# Patient Record
Sex: Male | Born: 1990 | Race: White | Hispanic: No | Marital: Single | Smoking: Current every day smoker
Health system: Southern US, Community
[De-identification: ages and names within clinical notes are randomized; demographics above are authoritative.]

## PROBLEM LIST (undated history)

## (undated) DIAGNOSIS — F319 Bipolar disorder, unspecified: Secondary | ICD-10-CM

## (undated) DIAGNOSIS — F4310 Post-traumatic stress disorder, unspecified: Secondary | ICD-10-CM

---
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
## 2014-09-18 ENCOUNTER — Encounter (HOSPITAL_COMMUNITY): Payer: Self-pay | Admitting: Emergency Medicine

## 2014-09-18 ENCOUNTER — Emergency Department (HOSPITAL_COMMUNITY)
Admission: EM | Admit: 2014-09-18 | Discharge: 2014-09-19 | Disposition: A | Discharge status: Other Acute Inpatient Hospital | Payer: BLUE CROSS/BLUE SHIELD | Attending: Emergency Medicine | Admitting: Emergency Medicine

## 2014-09-18 DIAGNOSIS — R45851 Suicidal ideations: Secondary | ICD-10-CM | POA: Diagnosis present

## 2014-09-18 DIAGNOSIS — F319 Bipolar disorder, unspecified: Secondary | ICD-10-CM | POA: Diagnosis not present

## 2014-09-18 DIAGNOSIS — F209 Schizophrenia, unspecified: Secondary | ICD-10-CM | POA: Diagnosis not present

## 2014-09-18 DIAGNOSIS — Z79899 Other long term (current) drug therapy: Secondary | ICD-10-CM | POA: Diagnosis not present

## 2014-09-18 DIAGNOSIS — F329 Major depressive disorder, single episode, unspecified: Secondary | ICD-10-CM

## 2014-09-18 DIAGNOSIS — F32A Depression, unspecified: Secondary | ICD-10-CM

## 2014-09-18 HISTORY — DX: Bipolar disorder, unspecified: F31.9

## 2014-09-18 LAB — COMPREHENSIVE METABOLIC PANEL
ALK PHOS: 50 U/L (ref 39–117)
ALT: 36 U/L (ref 0–53)
AST: 31 U/L (ref 0–37)
Albumin: 4.4 g/dL (ref 3.5–5.2)
Anion gap: 12 (ref 5–15)
BUN: 9 mg/dL (ref 6–23)
CO2: 22 mmol/L (ref 19–32)
Calcium: 10 mg/dL (ref 8.4–10.5)
Chloride: 103 mmol/L (ref 96–112)
Creatinine, Ser: 0.86 mg/dL (ref 0.50–1.35)
GFR calc non Af Amer: >90 mL/min (ref >90)
GLUCOSE: 100 mg/dL — AB (ref 70–99)
POTASSIUM: 3.6 mmol/L (ref 3.5–5.1)
SODIUM: 137 mmol/L (ref 135–145)
TOTAL PROTEIN: 7.9 g/dL (ref 6.0–8.3)
Total Bilirubin: 0.5 mg/dL (ref 0.3–1.2)

## 2014-09-18 LAB — RAPID URINE DRUG SCREEN, HOSP PERFORMED
AMPHETAMINES: NOT DETECTED
Barbiturates: NOT DETECTED
Benzodiazepines: NOT DETECTED
Cocaine: NOT DETECTED
OPIATES: NOT DETECTED
Tetrahydrocannabinol: NOT DETECTED

## 2014-09-18 LAB — CBC
HEMATOCRIT: 42.4 % (ref 39.0–52.0)
HEMOGLOBIN: 15.6 g/dL (ref 13.0–17.0)
MCH: 32.2 pg (ref 26.0–34.0)
MCHC: 36.8 g/dL — AB (ref 30.0–36.0)
MCV: 87.4 fL (ref 78.0–100.0)
Platelets: 218 10*3/uL (ref 150–400)
RBC: 4.85 MIL/uL (ref 4.22–5.81)
RDW: 12.5 % (ref 11.5–15.5)
WBC: 6.2 10*3/uL (ref 4.0–10.5)

## 2014-09-18 LAB — ACETAMINOPHEN LEVEL: Acetaminophen (Tylenol), Serum: <10 ug/mL — ABNORMAL LOW (ref 10–30)

## 2014-09-18 LAB — SALICYLATE LEVEL: Salicylate Lvl: <4 mg/dL (ref 2.8–20.0)

## 2014-09-18 LAB — ETHANOL: Alcohol, Ethyl (B): <5 mg/dL (ref 0–9)

## 2014-09-18 MED ORDER — NICOTINE 21 MG/24HR TD PT24: 21.0000 mg | MEDICATED_PATCH | Freq: Every day | TRANSDERMAL | Status: DC

## 2014-09-18 MED ORDER — IBUPROFEN 400 MG PO TABS: 600.0000 mg | ORAL_TABLET | Freq: Three times a day (TID) | ORAL | Status: DC | PRN

## 2014-09-18 MED ORDER — LORAZEPAM 1 MG PO TABS: 1.0000 mg | ORAL_TABLET | Freq: Three times a day (TID) | ORAL | Status: DC | PRN

## 2014-09-18 MED ORDER — ACETAMINOPHEN 325 MG PO TABS: 650.0000 mg | ORAL_TABLET | ORAL | Status: DC | PRN

## 2014-09-18 MED ORDER — ONDANSETRON HCL 4 MG PO TABS: 4.0000 mg | ORAL_TABLET | Freq: Three times a day (TID) | ORAL | Status: DC | PRN

## 2014-09-18 MED ORDER — ZOLPIDEM TARTRATE 5 MG PO TABS
5.0000 mg | ORAL_TABLET | Freq: Every evening | ORAL | Status: DC | PRN
  Administered 2014-09-18: 5 mg via ORAL
  Filled 2014-09-18: qty 1

## 2014-09-18 NOTE — ED Notes (Signed)
Pt given a Malaw[place]urkey sandwich since dinner will not be served until later. Pt cooperative/appreciative at this time.

## 2014-09-18 NOTE — ED Notes (Signed)
Pt will not look or answer RN; pt clinching fists and appears very agitated; security called to stand by

## 2014-09-18 NOTE — ED Notes (Signed)
Pt here from home with c/o si, no hi . Pt somewhat agitated but cooperative

## 2014-09-18 NOTE — ED Notes (Signed)
Pts mother called and is requesting patient to be transferred to [facility] because his psychiatrist is Dr. Cherylann RatelBrian Farrah and he is the Chief of Psychiatry at [facility].

## 2014-09-18 NOTE — Progress Notes (Addendum)
Patient has been referred for IP treatment to the following facilities: [facility], [facility], [facility], [facility], [facility], and [facility].  [facility] - [facility], fax referral. Per Wynona Caneshristine, will call writer back. [facility] - per Morrie SheldonAshley, at capacity now, fax referral. Per Ashley,referral faxed to the billing office, follow up in am. [facility], referral not found. Referral faxed again at 11pm. [facility] - per Jonny RuizJohn, fax referral and I will take a look at it and call you back. Per Jonny RuizJohn, referral not found. Referral faxed at 11pm [facility] - per Victorino DikeJennifer, fax referral, low acuity beds right now. Per Kriste BasqueBecky, call Pat in am to follow up on referral. [facility] - per Molly Maduroobert, fax referral, couple of adult beds. Per Jasmine DecemberSharon, at this time we haven't reviewed any referrals. [facility] - per [facility], fax referral. When followed up - voicemail. [facility] - per Thayer Ohmhris, fax referral. When writer followed up - sent to voicemail.  [facility] [address] [facility] - voicemail  [facility]   CSW will continue to seek placement.  Melbourne Abtsatia Omarion Minnehan, LCSWA Disposition staff 09/18/2014 4:40 PM
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
## 2014-09-18 NOTE — ED Notes (Signed)
[facility] and she states the patient meets inpatient criteria and placement will be sought but not until this evening around 7pm.
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
## 2014-09-18 NOTE — ED Notes (Signed)
Meal tray was ordered for dinner.

## 2014-09-18 NOTE — ED Notes (Signed)
Pt requesting medication to help him sleep.

## 2014-09-18 NOTE — ED Notes (Addendum)
Patients mother has called three times so far wanting updates on the patient with numerous questioning regarding patient plan, well-being of her son and wanting to know if the patient has been transferred to another facility yet.

## 2014-09-18 NOTE — ED Notes (Signed)
Myself and Shanda BumpsJessica, RN went into room to talk to pt; Pt not answering any questions; keeps breathing heavy, clutching his fists and rubbing his fists against his legs with intent

## 2014-09-19 ENCOUNTER — Inpatient Hospital Stay (HOSPITAL_COMMUNITY)
Admission: AD | Admit: 2014-09-19 | Discharge: 2014-09-24 | DRG: 885 | Disposition: A | Discharge status: Home / Self Care | Payer: BLUE CROSS/BLUE SHIELD | Source: Intra-hospital | Attending: Psychiatry | Admitting: Psychiatry

## 2014-09-19 ENCOUNTER — Encounter (HOSPITAL_COMMUNITY): Payer: Self-pay | Admitting: *Deleted

## 2014-09-19 DIAGNOSIS — F3130 Bipolar disorder, current episode depressed, mild or moderate severity, unspecified: Secondary | ICD-10-CM

## 2014-09-19 DIAGNOSIS — F3131 Bipolar disorder, current episode depressed, mild: Secondary | ICD-10-CM | POA: Diagnosis present

## 2014-09-19 DIAGNOSIS — F319 Bipolar disorder, unspecified: Secondary | ICD-10-CM | POA: Diagnosis present

## 2014-09-19 DIAGNOSIS — F329 Major depressive disorder, single episode, unspecified: Secondary | ICD-10-CM

## 2014-09-19 DIAGNOSIS — G4700 Insomnia, unspecified: Secondary | ICD-10-CM | POA: Diagnosis present

## 2014-09-19 DIAGNOSIS — R440 Auditory hallucinations: Secondary | ICD-10-CM | POA: Diagnosis not present

## 2014-09-19 DIAGNOSIS — R45851 Suicidal ideations: Secondary | ICD-10-CM | POA: Diagnosis present

## 2014-09-19 DIAGNOSIS — R45850 Homicidal ideations: Secondary | ICD-10-CM | POA: Diagnosis present

## 2014-09-19 DIAGNOSIS — F419 Anxiety disorder, unspecified: Secondary | ICD-10-CM | POA: Diagnosis present

## 2014-09-19 DIAGNOSIS — Z630 Problems in relationship with spouse or partner: Secondary | ICD-10-CM | POA: Diagnosis not present

## 2014-09-19 DIAGNOSIS — F4312 Post-traumatic stress disorder, chronic: Secondary | ICD-10-CM

## 2014-09-19 MED ORDER — CITALOPRAM HYDROBROMIDE 10 MG PO TABS
10.0000 mg | ORAL_TABLET | Freq: Every day | ORAL | Status: DC
  Administered 2014-09-19: 10 mg via ORAL
  Filled 2014-09-19: qty 1

## 2014-09-19 MED ORDER — ALUM & MAG HYDROXIDE-SIMETH 200-200-20 MG/5ML PO SUSP: 30.0000 mL | ORAL | Status: DC | PRN

## 2014-09-19 MED ORDER — CLONAZEPAM 0.5 MG PO TABS: 1.0000 mg | ORAL_TABLET | Freq: Every day | ORAL | Status: DC

## 2014-09-19 MED ORDER — MAGNESIUM HYDROXIDE 400 MG/5ML PO SUSP: 30.0000 mL | Freq: Every day | ORAL | Status: DC | PRN

## 2014-09-19 MED ORDER — ACETAMINOPHEN 325 MG PO TABS
650.0000 mg | ORAL_TABLET | Freq: Four times a day (QID) | ORAL | Status: DC | PRN
  Administered 2014-09-19: 650 mg via ORAL
  Filled 2014-09-19: qty 2

## 2014-09-19 MED ORDER — TRAZODONE HCL 50 MG PO TABS
50.0000 mg | ORAL_TABLET | Freq: Every evening | ORAL | Status: DC | PRN
  Administered 2014-09-19: 50 mg via ORAL
  Filled 2014-09-19 (×2): qty 1

## 2014-09-19 MED ORDER — VALACYCLOVIR HCL 500 MG PO TABS: 500.0000 mg | ORAL_TABLET | Freq: Every day | ORAL | Status: DC

## 2014-09-19 MED ORDER — RISPERIDONE 0.5 MG PO TABS: 3.0000 mg | ORAL_TABLET | Freq: Every day | ORAL | Status: DC

## 2014-09-19 NOTE — ED Notes (Signed)
CALLED [facility] TO TRANSPORT. THEY ARE UNABLE TO TRANSPORT UNTIL AROUND 415 TODAY

## 2014-09-19 NOTE — ED Notes (Signed)
PT HUSBAND HAS VISITED. NO CONFLICT NOTED. PT PARENTS ALSO HERE BUT LEFT AS SOON AS PT HUSBAND ARRIVED
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
## 2014-09-19 NOTE — ED Notes (Signed)
[facility]

## 2014-09-19 NOTE — Tx Team (Signed)
Initial Interdisciplinary Treatment Plan   PATIENT STRESSORS: Marital or family conflict   PATIENT STRENGTHS: Average or above average intelligence Communication skills General fund of knowledge Motivation for treatment/growth Physical Health Supportive family/friends   PROBLEM LIST: Problem List/Patient Goals Date to be addressed Date deferred Reason deferred Estimated date of resolution  "I'd like to get better." 09/19/14           "I want to prevent breaking down again." 09/19/14           Command AH to harm self 09/19/14                              DISCHARGE CRITERIA:  Improved stabilization in mood, thinking, and/or behavior Motivation to continue treatment in a less acute level of care Reduction of life-threatening or endangering symptoms to within safe limits Verbal commitment to aftercare and medication compliance  PRELIMINARY DISCHARGE PLAN: Attend aftercare/continuing care group Outpatient therapy Return to previous living arrangement  PATIENT/FAMIILY INVOLVEMENT: This treatment plan has been presented to and reviewed with the patient, Merton BorderRobert Ma, and/or family member.  The patient and family have been given the opportunity to ask questions and make suggestions.  Lawrence MarseillesFriedman, Harlon Kutner Eakes 09/19/2014, 6:36 PM

## 2014-09-19 NOTE — ED Provider Notes (Signed)
[facility], Dr. Elna BreslowEappen accepting.  Pricilla LovelessScott Brittani Purdum, MD 09/19/14 [phone]

## 2014-09-19 NOTE — ED Notes (Signed)
Pt's mother called to request placement at [facility]. Also, pt's mother was advised of visitation policy.

## 2014-09-19 NOTE — Progress Notes (Signed)
Did not attend group 

## 2014-09-19 NOTE — Progress Notes (Signed)
[facility]. Rosey BathKelly Railynn Ballo, RN

## 2014-09-19 NOTE — Progress Notes (Addendum)
CSW followed up on referrals for inpatient tx. ([place] [facility])  Referral faxed to and/or under review: [facility]- per Synetta FailAnita, fax [facility] per [facility], fax referral [facility]- per Angelica, fax referral [facility]- per Christiane HaJonathan, not yet reviewed but as pt has no insurance would be for [employer] waitlist  [facility]- per Thayer Ohmhris, not yet reviewed  Voicemail: [facility]- left 2 voicemails as pt's mother is requesting this placement  [facility]- per Rogers Blockerean - [facility]- per [facility]- per Agustin Creearlene  Decl[facility] at: [facility]- per Wylene MenLacey d/t financial reasons- no insurance coverage would require $4000 deposit  Ilean SkillMeghan Kwabena Strutz, MSW, [facility] Clinical Social Work, Disposition  09/19/2014 [phone]

## 2014-09-19 NOTE — Progress Notes (Signed)
Pts wife (legal spouse) emergency contact info Thayer OhmChris [phone]

## 2014-09-19 NOTE — Progress Notes (Signed)
Patient in bed resting at the beginning of this shift. His husband visited and patient requested that the sealed envelope in his locker be remove and given to his husband.  Writer notified the security and brought the sealed envelope to patient. He asked his husband to take the envelope open it and make sure everything was intact  inside the enveloped. Husband opended the envelope and told patient the  items that were in the envelope. Writer asked patient if they were accurate, patient said yes. Writer asked patient to sign in off on the sealed envelope that he received the envelope. Patient seemed uninterested in other assessment. Q 15 minute check continues as ordered to maintain safety.

## 2014-09-19 NOTE — Progress Notes (Signed)
Per [name] [name], [name] [name], [facility]2 by Dr. Elna [name].   Spoke with [facility] RN regarding pt's disposition.  [name] [name], MSW, LCSWA Clinical Social Work, Disposition  09/19/2014 [phone]

## 2014-09-20 ENCOUNTER — Encounter (HOSPITAL_COMMUNITY): Payer: Self-pay | Admitting: Registered Nurse

## 2014-09-20 DIAGNOSIS — Z630 Problems in relationship with spouse or partner: Secondary | ICD-10-CM

## 2014-09-20 DIAGNOSIS — R440 Auditory hallucinations: Secondary | ICD-10-CM

## 2014-09-20 DIAGNOSIS — R45851 Suicidal ideations: Secondary | ICD-10-CM

## 2014-09-20 DIAGNOSIS — R45850 Homicidal ideations: Secondary | ICD-10-CM

## 2014-09-20 MED ORDER — CLONAZEPAM 1 MG PO TABS
1.0000 mg | ORAL_TABLET | Freq: Every day | ORAL | Status: DC
  Administered 2014-09-20 – 2014-09-23 (×3): 1 mg via ORAL
  Filled 2014-09-20 (×4): qty 1

## 2014-09-20 MED ORDER — RISPERIDONE 2 MG PO TABS
2.0000 mg | ORAL_TABLET | Freq: Every day | ORAL | Status: DC
Start: 2014-09-20 — End: 2014-09-24
  Administered 2014-09-20 – 2014-09-24 (×5): 2 mg via ORAL
  Filled 2014-09-20 (×2): qty 1
  Filled 2014-09-20: qty 4
  Filled 2014-09-20 (×6): qty 1

## 2014-09-20 MED ORDER — CITALOPRAM HYDROBROMIDE 20 MG PO TABS
20.0000 mg | ORAL_TABLET | Freq: Every day | ORAL | Status: DC
  Administered 2014-09-20 – 2014-09-24 (×5): 20 mg via ORAL
  Filled 2014-09-20 (×2): qty 1
  Filled 2014-09-20: qty 4
  Filled 2014-09-20 (×6): qty 1
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
## 2014-09-20 NOTE — Progress Notes (Signed)
Pt did not attend wrap up group this evening.  

## 2014-09-20 NOTE — [facility] Group Notes (Signed)
[facility] Group Notes:  (Clinical Social Work)  09/20/2014  11:15-12:00PM  Summary of Progress/Problems:   The main focus of today's process group was to discuss patients' feelings related to being hospitalized, as well as the difference between "being" and "having" a mental health diagnosis.  It was agreed in general by the group that it would be preferable to avoid future hospitalizations, and we discussed means of doing that.  As a follow-up, problems with adhering to medication recommendations were discussed.  The patient expressed their primary feeling about being hospitalized is "comfortable, [facility] where I stayed to age 24yo."  He stated he knows he needs help, "I'm mentally ill."  He wishes his comic book had not been taken away, but understands this was done because it was so heavy.  He later stated, "I know I'm crazy" and the group disagreed with this, stated that there is no such thing as "normal."  He also talked about knowing he needs to be in the hospital to get better, because he had gotten mad at his wife, felt like hurting her and himself, and it was much better for him to be hospitalized.  He wants to take his medications at discharge to avoid coming back to the hospital.  Type of Therapy:  Group Therapy - Process  Participation Level:  Active  Participation Quality:  Attentive, Sharing and Supportive  Affect:  Not Congruent  Cognitive:  Disorganized  Insight:  Improving  Engagement in Therapy:  Engaged  Modes of Intervention:  Exploration, Discussion  Ambrose MantleMareida Grossman-Orr, LCSW 09/20/2014, 12:16 PM

## 2014-09-20 NOTE — [facility] Group Notes (Signed)
[facility] Group Notes:  (Nursing/MHT/Case Management/Adjunct)  Date:  09/20/2014  Time:  11:42 AM  Type of Therapy:  Psychoeducational Skills  Participation Level:  Active  Participation Quality:  Appropriate  Affect:  Appropriate  Cognitive:  Appropriate  Insight:  Appropriate  Engagement in Group:  Engaged  Modes of Intervention:  Discussion  Summary of Progress/Problems: Pt did attend self inventory group.  Jacquelyne BalintForrest, Munirah Doerner Shanta 09/20/2014, 11:42 AM
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
## 2014-09-20 NOTE — Progress Notes (Signed)
Patient ID: [name] BorderRobert Wortmann, male   DOB: 07/14/1990, 24 y.o.   MRN: [id number]   D: Pt has been very flat and depressed today on the unit. Pt reported that he was tired and that he just wanted to rest. Pt remained in the bed most of the day, he did get up to take medication at lunch. Pt reported that his depression was a 6, his hopelessness was a 6, and his anxiety was a 2. Pt reported that his goal for today was to stop listening to the voices . Pt reported being negative HI, no AH/VH noted. Pt did report being positive for SI, and was able to contract for safety. A: 15 min checks continued for patient safety. R: Pt safety maintained.

## 2014-09-21 ENCOUNTER — Encounter (HOSPITAL_COMMUNITY): Payer: Self-pay | Admitting: Registered Nurse

## 2014-09-21 DIAGNOSIS — F3130 Bipolar disorder, current episode depressed, mild or moderate severity, unspecified: Secondary | ICD-10-CM

## 2014-09-21 DIAGNOSIS — F3131 Bipolar disorder, current episode depressed, mild: Secondary | ICD-10-CM | POA: Diagnosis present

## 2014-09-21 NOTE — Progress Notes (Signed)
D: Pt slept thru most of the shift. However, when asked about his day pt stated, "I haven't heard voices since 1500. I haven't heard them since I took my risperdal and citalopram. Pt voiced no questions or concerns at this time.  A:  Support and encouragement was offered. 15 min checks continued for safety.  R: Pt remains safe.

## 2014-09-21 NOTE — Progress Notes (Signed)
[facility] Group Notes:  (Nursing/MHT/Case Management/Adjunct)  Date:  09/21/2014  Time:  8:56 PM  Type of Therapy:  Psychoeducational Skills  Participation Level:  Active  Participation Quality:  Appropriate  Affect:  Appropriate  Cognitive:  Appropriate  Insight:  Appropriate  Engagement in Group:  Engaged  Modes of Intervention:  Education  Summary of Progress/Problems: The patient expressed that he had a pretty good day since he was no longer hearing voices. In addition, he stated that he was reading a lot today. As a theme for the day, his coping skill is to read, watch t.v., and lift weights.   Hazle CocaGOODMAN, Hadlea Furuya S 09/21/2014, 8:56 PM

## 2014-09-21 NOTE — Progress Notes (Signed)
[facility]  09/21/2014 2:40 PM [name] BorderRobert Brittian  MRN:  [id number]    Subjective:  Patient states that he is feeling much better.  "I feel good; I'm sleeping good; I'm eating well.  Yesterday I was a little sleepy when my mom and dad came; but I feel good now."  Patient states that he constantly has thoughts of how to kill himself.  "I constantly have thoughts of how to kill my self; it never goes away.  I don't want to die; I just have the thoughts.  I only had them once today when I was in the shower. Other than that I'm pretty happy.  I been going to group and contributing to group.". He is able to contract for safety while in hospital.   Objective:  Patient is tolerating his medications without adverse reactions and he is participating in group sessions. Patient was seen looking out his window talking when asked what he was doing patient stated "I was just looking at the squirrel and asking him what he is doing with the leaf."      Principal Problem: Bipolar I disorder, most recent episode depressed Diagnosis:   Patient Active Problem List   Diagnosis Date Noted  . Bipolar I disorder, most recent episode depressed [F31.30] 09/21/2014  . Depression [F32.9] 09/19/2014   Total Time spent with patient: 20 minutes   Past Medical History:  Past Medical History  Diagnosis Date  . Bipolar 1 disorder    History reviewed. No pertinent past surgical history. Family History: History reviewed. No pertinent family history. Social History:  History  Alcohol Use  . Yes     History  Drug Use No    History   Social History  . Marital Status: Single    Spouse Name: N/A  . Number of Children: N/A  . Years of Education: N/A   Social History Main Topics  . Smoking status: Never Smoker   . Smokeless tobacco: Not on file  . Alcohol Use: Yes  . Drug Use: No  . Sexual Activity: Not on file   Other Topics Concern  . None   Social History Narrative   Additional History:     Sleep: Good  Appetite:  Good   Assessment:   Musculoskeletal: Strength & Muscle Tone: within normal limits Gait & Station: normal Patient leans: N/A   Psychiatric Specialty Exam: Physical Exam  Constitutional: He is oriented to person, place, and time.  Neck: Normal range of motion.  Respiratory: Effort normal.  Musculoskeletal: Normal range of motion.  Neurological: He is alert and oriented to person, place, and time.  Skin: Skin is warm and dry.  Psychiatric: His mood appears anxious. He is actively hallucinating. He exhibits a depressed mood.    Review of Systems  Psychiatric/Behavioral: Positive for depression and hallucinations. The patient is nervous/anxious.        Agitation an aggressive behavior  All other systems reviewed and are negative.    Blood pressure 112/63, pulse 83, temperature 97.6 F (36.4 C), temperature source Oral, resp. rate 18, height 5\' 11"  (1.803 m), weight 87.091 kg (192 lb).Body mass index is 26.79 kg/(m^2).  General Appearance: Casual  Eye Contact::  Good  Speech:  Clear and Coherent and Normal Rate  Volume:  Normal  Mood:  Anxious, Depressed and Irritable  Affect:  Non-Congruent and Depressed  Thought Process:  Circumstantial and Linear  Orientation:  Full (Time, Place, and Person)  Thought Content:  Hallucinations: Auditory, Paranoid Ideation  and Rumination  Denies hallucinations at this time  Suicidal Thoughts:  Yes.  without intent/plan , contract for safety  Homicidal Thoughts:  No Denies homicidal ideation at this time  Memory:  Recent;   Fair Remote;   Fair  Judgement:  Impaired  Insight:  Fair  Psychomotor Activity:  Restlessness  Concentration:  Fair  Recall:  Good  Fund of Knowledge:Good  Language: Good  Akathisia:  No  Handed:  Right  AIMS (if indicated):     Assets:  Communication Skills Desire for Improvement Housing Social Support  ADL's:  Intact  Cognition: WNL  Sleep:  Number of Hours: 6.75     Current  Medications: Current Facility-Administered Medications  Medication Dose Route Frequency Provider Last Rate Last Dose  . acetaminophen (TYLENOL) tablet 650 mg  650 mg Oral Q6H PRN Adonis Brook, NP   650 mg at 09/19/14 1846  . alum & mag hydroxide-simeth (MAALOX/MYLANTA) 200-200-20 MG/5ML suspension 30 mL  30 mL Oral Q4H PRN Adonis Brook, NP      . citalopram (CELEXA) tablet 20 mg  20 mg Oral Daily Leata Mouse, MD   20 mg at 09/21/14 0751  . clonazePAM (KLONOPIN) tablet 1 mg  1 mg Oral QHS Leata Mouse, MD   1 mg at 09/20/14 2233  . magnesium hydroxide (MILK OF MAGNESIA) suspension 30 mL  30 mL Oral Daily PRN Adonis Brook, NP      . risperiDONE (RISPERDAL) tablet 2 mg  2 mg Oral Daily Leata Mouse, MD   2 mg at 09/21/14 0751  . traZODone (DESYREL) tablet 50 mg  50 mg Oral QHS PRN Adonis Brook, NP   50 mg at 09/19/14 2231    Lab Results: No results found for this or any previous visit (from the past 48 hour(s)).  Physical Findings: AIMS: Facial and Oral Movements Muscles of Facial Expression: None, normal Lips and Perioral Area: None, normal Jaw: None, normal Tongue: None, normal,Extremity Movements Upper (arms, wrists, hands, fingers): None, normal Lower (legs, knees, ankles, toes): None, normal, Trunk Movements Neck, shoulders, hips: None, normal, Overall Severity Severity of abnormal movements (highest score from questions above): None, normal Incapacitation due to abnormal movements: None, normal Patient's awareness of abnormal movements (rate only patient's report): No Awareness, Dental Status Current problems with teeth and/or dentures?: No Does patient usually wear dentures?: No  CIWA:    COWS:     Treatment Plan Summary: Daily contact with patient to assess and evaluate symptoms and progress in treatment and Medication management  1.  2. Admitted for crisis management and stabilization 3. Medication management to reduce current  symptoms to bale line and improve the patient's overall level of  functioning:  Risperidone for psychosis, Celexa for depression and  Klonopin for anxiety  4. Treat health problems as indicated 5. Develop treatment plan to decrease risk of relapse upon discharge and the need for  readmission. 6. Psycho-social education regarding relapse prevention and self care. 7. Health care follow up as needed for medical problems     Medical Decision Making:  Review of Psycho-Social Stressors (1), Review or order clinical lab tests (1), Review of Last Therapy Session (1), Independent Review of image, tracing or specimen (2) and Review of Medication Regimen & Side Effects (2)   Rankin, Shuvon, FNP-BC 09/21/2014, 2:40 PM  Reviewed the information documented and agree with the treatment plan.  Annalissa Murphey,JANARDHAHA R. 09/22/2014 8:19 AM

## 2014-09-21 NOTE — [facility] Group Notes (Signed)
[facility] Group Notes:  (Nursing/MHT/Case Management/Adjunct)  Date:  09/21/2014  Time:  3:01 PM  Type of Therapy:  Psychoeducational Skills  Participation Level:  Active  Participation Quality:  Appropriate  Affect:  Appropriate  Cognitive:  Appropriate  Insight:  Appropriate  Engagement in Group:  Engaged  Modes of Intervention:  Discussion  Summary of Progress/Problems: Pt did attend self inventory group.   Jacquelyne BalintForrest, Lashunda Greis Shanta 09/21/2014, 3:01 PM

## 2014-09-21 NOTE — Progress Notes (Addendum)
Patient ID: [name] BorderRobert [name], male   DOB: 26-Apr-1991, 24 y.o.   MRN: [id number]   D: Pt has been very flat and depressed on the unit today. Pt reported that he did feel better since being back on his medication. Pt did report that he was actively suicidal, and had a plan to hurt self. This Clinical research associatewriter processed with patient, patient reported that he would contract for safety. Pt reported that he was positive for voices, but they were getting better. Pt reported that his goal for today was to not thinking about hurting self. Pt reported being negative HI, no VH noted. A: 15 min checks continued for patient safety. R: Pt safety maintained.

## 2014-09-21 NOTE — [facility] Group Notes (Signed)
[facility] Group Notes: (Clinical Social Work)   09/21/2014      Type of Therapy:  Group Therapy   Participation Level:  Did Not Attend despite MHT prompting   Ambrose MantleMareida Grossman-Orr, LCSW 09/21/2014, 1:04 PM

## 2014-09-22 NOTE — [facility] Group Notes (Signed)
[facility] LCSW Group Therapy  09/22/2014 1:15 pm  Type of Therapy: Process Group Therapy  Participation Level:  Active  Participation Quality:  Appropriate  Affect:  Flat  Cognitive:  Oriented  Insight:  Improving  Engagement in Group:  Limited  Engagement in Therapy:  Limited  Modes of Intervention:  Activity, Clarification, Education, Problem-solving and Support  Summary of Progress/Problems: Today's group addressed the issue of overcoming obstacles.  Patients were asked to identify their biggest obstacle post d/c that stands in the way of their on-going success, and then problem solve as to how to manage this.  Talked about his on-going depression he has been dealing with since age 24.  Was willing/able to problem solve re: what has been helpful in past, and determined that he does best when spending ample time outside and staying occupied.  Has not worked since Dec., which he states is a large contributor to his depression.  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  Ida Rogueorth, Beyza Bellino B 09/22/2014   2:36 PM

## 2014-09-22 NOTE — Progress Notes (Signed)
   D: Pt informed the writer that he's scheduled for discharge tomorrow.  Initially pt stated he "always" hears voices. However, pt stated earlier that he doesn't hear the voices once he takes his meds. Pt clarified and informed the writer that he likes to take his meds in the morning which helps alleviate the voices. Pt voiced no questions or concerns.  A:  Support and encouragement was offered. 15 min checks continued for safety.  R: Pt remains safe.

## 2014-09-22 NOTE — [facility] Group Notes (Signed)
[name]   09/22/2014 8:42 AM  Participation Quality:  Engaged  Mood/Affect:  Flat  Depression Rating:  4  Anxiety Rating:  3  Thoughts of Suicide:  No Will you contract for safety?   NA  Current AVH:  No  Plan for Discharge/Comments:  Pt states he was hearing voices and seeing things prior to admission.  Lives at home with wife and sees outpt therapist and psychiatrist.  Insists he was compliant with medication.  States he needs medication adjustment and the routine of our milieu.  "I've been eating well and sleeping well here.  I'm doing a lot better."  Transportation Means: family  Supports: family  Kiribatiorth, [name] [name]
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
## 2014-09-22 NOTE — Progress Notes (Signed)
The focus of this group is to help patients review their daily goal of treatment and discuss progress on daily workbooks.  Pt did not attend the evening group session. 

## 2014-09-22 NOTE — Progress Notes (Signed)
Patient in bed at the beginning of the shift. His mother sat with him visiting. He endorsed a great day and reported that he is feeling better. Patient denied SI/HI and denied Hallucinations. Writer encouraged and supported patient. Q 15 minute check continues as ordered to maintain safety.

## 2014-09-22 NOTE — [facility] Suicide Risk Assessment (Signed)
[facility] INPATIENT:  Family/Significant Other Suicide Prevention Education  Suicide Prevention Education:  Education Completed;  Thayer OhmChris, [place], [id number] [phone] has been identified by the patient as the family member/significant other with whom the patient will be residing, and identified as the person(s) who will aid the patient in the event of a mental health crisis (suicidal ideations/suicide attempt).  With written consent from the patient, the family member/significant other has been provided the following suicide prevention education, prior to the and/or following the discharge of the patient.  The suicide prevention education provided includes the following:  Suicide risk factors  Suicide prevention and interventions  National Suicide Hotline telephone number  [facility] assessment telephone number  [facility] Emergency Assistance 911  [facility] and/or Residential Mobile Crisis Unit telephone number  Request made of family/significant other to:  Remove weapons (e.g., guns, rifles, knives), all items previously/currently identified as safety concern.    Remove drugs/medications (over-the-counter, prescriptions, illicit drugs), all items previously/currently identified as a safety concern.  The family member/significant other verbalizes understanding of the suicide prevention education information provided.  The family member/significant other agrees to remove the items of safety concern listed above.  [name] [name], [name] [name] 09/22/2014, 4:39 PM

## 2014-09-22 NOTE — Progress Notes (Signed)
Patient ID: [name] [name], male   DOB: Jan 16, 1991, 24 y.o.   MRN: [id number] [facility] MD Progress Note  09/22/2014 6:25 PM [name] [name]  MRN:  [id number]    Subjective:   Patient has reported that he is feeling partially better than upon admission. He denies medication side effects.    Objective:  I have discussed case with treatment team and have also met with patient. He presented to ED reporting depression , suicidal thoughts, and hallucinations. Has been diagnosed with Bipolar Disorder . He reports he has been having some marital tension , and states there has been marital distancing due to her working long hours. Describes lack of daily structure, as he is currently unemployed - states " I just stay at home watching TV". He emphasizes he is feeling much better than upon admission. He is not currently having any SI, and at this time is not actively psychotic or internally preoccupied. He is denying medication side effects. He is focused on being discharged soon , and is looking forward to reunite with his wife. Of note, patient's mother called via phone and with patient's express consent and in his presence we had a phone meeting with mother/patient. Mother reported that she was hoping that Probation officer can contact patient's outpatient psychiatrist, Dr. Arvilla Market, at [facility], as Dr. Arvilla Market knows patient , and patient plans to follow up with him after discharge. Mother was also wanting to have patient transferred to [place] order to receive treatment by Dr Arvilla Market, but patient stated he felt comfortable here at [facility] and was not wanting to pursue any transfer attempts at this time. Patient's behavior on unit calm and in good control. No disruptive or agitated behaviors .  He has been going to groups.    Principal Problem: Bipolar I disorder, most recent episode depressed Diagnosis:   Patient Active Problem List   Diagnosis Date Noted  . Bipolar I  disorder, most recent episode depressed [F31.30] 09/21/2014  . Depression [F32.9] 09/19/2014   Total Time spent with patient: 30 minutes   Past Medical History:  Past Medical History  Diagnosis Date  . Bipolar 1 disorder    History reviewed. No pertinent past surgical history. Family History: History reviewed. No pertinent family history. Social History:  History  Alcohol Use  . Yes     History  Drug Use No    History   Social History  . Marital Status: Single    Spouse Name: N/A  . Number of Children: N/A  . Years of Education: N/A   Social History Main Topics  . Smoking status: Never Smoker   . Smokeless tobacco: Not on file  . Alcohol Use: Yes  . Drug Use: No  . Sexual Activity: Not on file   Other Topics Concern  . None   Social History Narrative   Additional History:    Sleep: Good  Appetite:  Good   Assessment:   Musculoskeletal: Strength & Muscle Tone: within normal limits Gait & Station: normal Patient leans: N/A   Psychiatric Specialty Exam: Physical Exam  Constitutional: He is oriented to person, place, and time.  Neck: Normal range of motion.  Respiratory: Effort normal.  Musculoskeletal: Normal range of motion.  Neurological: He is alert and oriented to person, place, and time.  Skin: Skin is warm and dry.  Psychiatric: His mood appears anxious. He is actively hallucinating. He exhibits a depressed mood.    Review of Systems  Constitutional: Negative.  HENT: Negative.   Respiratory: Negative.   Cardiovascular: Negative.   Gastrointestinal: Negative.   Genitourinary: Negative.   Musculoskeletal: Negative.   Skin: Negative.   Neurological: Negative.   Endo/Heme/Allergies: Negative.   Psychiatric/Behavioral: Positive for depression.     Blood pressure 104/59, pulse 71, temperature 99.2 F (37.3 C), temperature source Oral, resp. rate 16, height '5\' 11"'  (1.803 m), weight 192 lb (87.091 kg).Body mass index is 26.79 kg/(m^2).   General Appearance: Casual  Eye Contact::  Good  Speech:  Clear, coherent   Volume:  Normal  Mood:  States he feels better, but presents with somewhat constricted affect   Affect:   Constricted   Thought Process:  Circumstantial and Linear  Orientation:  Full (Time, Place, and Person)  Thought Content:  at this time denies active hallucinations and does not appear internally preoccupied     Suicidal Thoughts:  No , contract for safety  Homicidal Thoughts:  No Denies homicidal ideation at this time. Specifically also denies any thoughts of violence towards wife or any family member   Memory:  Recent;   Fair Remote;   Fair  Judgement:  Fair  Insight:  Fair  Psychomotor Activity:  Decreased  Concentration:  Fair  Recall:  Good  Fund of Knowledge:Good  Language: Good  Akathisia:  No  Handed:  Right  AIMS (if indicated):     Assets:  Communication Skills Desire for Improvement Housing Social Support  ADL's:  Intact  Cognition: WNL  Sleep:  Number of Hours: 6.75     Current Medications: Current Facility-Administered Medications  Medication Dose Route Frequency Provider Last Rate Last Dose  . acetaminophen (TYLENOL) tablet 650 mg  650 mg Oral Q6H PRN Kerrie Buffalo, NP   650 mg at 09/19/14 1846  . alum & mag hydroxide-simeth (MAALOX/MYLANTA) 200-200-20 MG/5ML suspension 30 mL  30 mL Oral Q4H PRN Kerrie Buffalo, NP      . citalopram (CELEXA) tablet 20 mg  20 mg Oral Daily Ambrose Finland, MD   20 mg at 09/22/14 1660  . clonazePAM (KLONOPIN) tablet 1 mg  1 mg Oral QHS Ambrose Finland, MD   1 mg at 09/21/14 2110  . magnesium hydroxide (MILK OF MAGNESIA) suspension 30 mL  30 mL Oral Daily PRN Kerrie Buffalo, NP      . risperiDONE (RISPERDAL) tablet 2 mg  2 mg Oral Daily Ambrose Finland, MD   2 mg at 09/22/14 6301  . traZODone (DESYREL) tablet 50 mg  50 mg Oral QHS PRN Kerrie Buffalo, NP   50 mg at 09/19/14 2231    Lab Results: No results found for this or  any previous visit (from the past 48 hour(s)).  Physical Findings: AIMS: Facial and Oral Movements Muscles of Facial Expression: None, normal Lips and Perioral Area: None, normal Jaw: None, normal Tongue: None, normal,Extremity Movements Upper (arms, wrists, hands, fingers): None, normal Lower (legs, knees, ankles, toes): None, normal, Trunk Movements Neck, shoulders, hips: None, normal, Overall Severity Severity of abnormal movements (highest score from questions above): None, normal Incapacitation due to abnormal movements: None, normal Patient's awareness of abnormal movements (rate only patient's report): No Awareness, Dental Status Current problems with teeth and/or dentures?: No Does patient usually wear dentures?: No  CIWA:    COWS:      Assessment- at this time patient reports improvement compared to admission and denies any current active hallucinations and does not appear internally preoccupied. Affect does appear constricted, blunted, but patient states his mood is improved.  Mother wanting patient to transfer to [place] for management by his outpatient psychiatrist , but patient himself not wanting to pursue transfer efforts at this time. Tolerating medications well.   Treatment Plan Summary: Daily contact with patient to assess and evaluate symptoms and progress in treatment and Medication management  1. Continue medication management as below 2.  Risperidone  2 mgrs QHS for psychosis, Celexa 20 mgrs QDAY  for depression and Klonopin  1 mgrs QHS for for anxiety / insomnia . 3. I have requested that patient provide written consent to allow  contact with his outpatient provider, Dr. Arvilla Market.       Medical Decision Making:  Established Problem, Stable/Improving (1), Review of Psycho-Social Stressors (1), Review of Last Therapy Session (1) and Review of Medication Regimen & Side Effects (2)   Neita Garnet, MD 09/22/2014, 6:25 PM

## 2014-09-23 MED ORDER — TRAZODONE HCL 50 MG PO TABS
25.0000 mg | ORAL_TABLET | Freq: Every evening | ORAL | Status: DC | PRN
  Administered 2014-09-23: 25 mg via ORAL
  Filled 2014-09-23: qty 1
  Filled 2014-09-23: qty 2

## 2014-09-23 NOTE — Progress Notes (Signed)
Patient ID: [name] [name], male   DOB: 1991/05/06, 24 y.o.   MRN: [id number] [facility] MD Progress Note  09/23/2014 6:40 PM Shaunte Weissinger  MRN:  [id number]    Subjective:   Patient reports he is doing "OK", and is denying any current medication side effects.    Objective:  I have discussed case with treatment team and have also met with patient. As per staff, behavior has been calm, in good control, and not disruptive or agitated. Group participation has been limited but appropriate and engaged when in attendance. States visit from mother yesterday was positive . He denies medication side effects and feels medications are helping and are well tolerated . As noted, patient's mother was asking for patient to be transferred to [facility] as he is known by staff there. Patient has declined to do this, stating he is feeling better, and does not think he needs to go elsewhere. He states he feels he will be ready for discharge soon.  Tends to spend long periods of time in bed , sleeping until late in AM. We discussed this- denies any excessive sedation, and states it is because at home he normally sleeps late so that this is his normal sleep /wake pattern.   Principal Problem: Bipolar I disorder, most recent episode depressed Diagnosis:   Patient Active Problem List   Diagnosis Date Noted  . Bipolar I disorder, most recent episode depressed [F31.30] 09/21/2014  . Depression [F32.9] 09/19/2014   Total Time spent with patient: 20 minutes   Past Medical History:  Past Medical History  Diagnosis Date  . Bipolar 1 disorder    History reviewed. No pertinent past surgical history. Family History: History reviewed. No pertinent family history. Social History:  History  Alcohol Use  . Yes     History  Drug Use No    History   Social History  . Marital Status: Single    Spouse Name: N/A  . Number of Children: N/A  . Years of Education: N/A   Social History Main Topics   . Smoking status: Never Smoker   . Smokeless tobacco: Not on file  . Alcohol Use: Yes  . Drug Use: No  . Sexual Activity: Not on file   Other Topics Concern  . None   Social History Narrative   Additional History:    Sleep: Good  Appetite:  Good   Assessment:   Musculoskeletal: Strength & Muscle Tone: within normal limits Gait & Station: normal Patient leans: N/A   Psychiatric Specialty Exam: Physical Exam  Constitutional: He is oriented to person, place, and time.  Neck: Normal range of motion.  Respiratory: Effort normal.  Musculoskeletal: Normal range of motion.  Neurological: He is alert and oriented to person, place, and time.  Skin: Skin is warm and dry.  Psychiatric: His mood appears anxious. He is actively hallucinating. He exhibits a depressed mood.    Review of Systems  Constitutional: Negative.   HENT: Negative.   Eyes: Negative.   Respiratory: Negative.   Cardiovascular: Negative.   Gastrointestinal: Negative.   Genitourinary: Negative.   Musculoskeletal: Negative.   Skin: Negative.   Neurological: Negative.   Endo/Heme/Allergies: Negative.   Psychiatric/Behavioral: Positive for depression.     Blood pressure 136/65, pulse 78, temperature 97.9 F (36.6 C), temperature source Oral, resp. rate 20, height _0  (1.803 m), weight 192 lb (87.091 kg).Body mass index is 26.79 kg/(m^2).  General Appearance: Casual  Eye Contact::  Good  Speech:  Clear, coherent   Volume:  Normal  Mood:   States mood improved at present and is feeling better.  Affect:   Less constricted, appears vaguely irritable at times, not expansive .  Thought Process:  Linear  Orientation:  Full (Time, Place, and Person)  Thought Content:  at this time denies active hallucinations and does not appear internally preoccupied     Suicidal Thoughts:  No , contract for safety  Homicidal Thoughts:  No Denies homicidal ideation at this time. Specifically also denies any thoughts of  violence towards wife or any family member   Memory:  Recent;   Fair Remote;   Fair  Judgement:  Fair  Insight:  Fair  Psychomotor Activity:  Normal  Concentration:  Fair  Recall:  Good  Fund of Knowledge:Good  Language: Good  Akathisia:  No  Handed:  Right  AIMS (if indicated):     Assets:  Communication Skills Desire for Improvement Housing Social Support  ADL's:  Improving   Cognition: WNL  Sleep:  Number of Hours: 6.75     Current Medications: Current Facility-Administered Medications  Medication Dose Route Frequency Provider Last Rate Last Dose  . acetaminophen (TYLENOL) tablet 650 mg  650 mg Oral Q6H PRN Kerrie Buffalo, NP   650 mg at 09/19/14 1846  . alum & mag hydroxide-simeth (MAALOX/MYLANTA) 200-200-20 MG/5ML suspension 30 mL  30 mL Oral Q4H PRN Kerrie Buffalo, NP      . citalopram (CELEXA) tablet 20 mg  20 mg Oral Daily Ambrose Finland, MD   20 mg at 09/23/14 0816  . clonazePAM (KLONOPIN) tablet 1 mg  1 mg Oral QHS Ambrose Finland, MD   1 mg at 09/21/14 2110  . magnesium hydroxide (MILK OF MAGNESIA) suspension 30 mL  30 mL Oral Daily PRN Kerrie Buffalo, NP      . risperiDONE (RISPERDAL) tablet 2 mg  2 mg Oral Daily Ambrose Finland, MD   2 mg at 09/23/14 0816  . traZODone (DESYREL) tablet 50 mg  50 mg Oral QHS PRN Kerrie Buffalo, NP   50 mg at 09/19/14 2231    Lab Results: No results found for this or any previous visit (from the past 48 hour(s)).  Physical Findings: AIMS: Facial and Oral Movements Muscles of Facial Expression: None, normal Lips and Perioral Area: None, normal Jaw: None, normal Tongue: None, normal,Extremity Movements Upper (arms, wrists, hands, fingers): None, normal Lower (legs, knees, ankles, toes): None, normal, Trunk Movements Neck, shoulders, hips: None, normal, Overall Severity Severity of abnormal movements (highest score from questions above): None, normal Incapacitation due to abnormal movements: None,  normal Patient's awareness of abnormal movements (rate only patient's report): No Awareness, Dental Status Current problems with teeth and/or dentures?: No Does patient usually wear dentures?: No  CIWA:    COWS:      Assessment- patient continues to report improvement of mood, and states he is feeling better. Denies medication side effects.   Treatment Plan Summary: Daily contact with patient to assess and evaluate symptoms and progress in treatment and Medication management  1. Continue medication management as below 2.  Risperidone  2 mgrs QHS for psychosis, Celexa 20 mgrs QDAY  for depression, Klonopin  1 mgrs QHS for for anxiety / insomnia . 3. Consider Discharge soon as he continues to stabilize        Medical Decision Making:  Established Problem, Stable/Improving (1), Review of Psycho-Social Stressors (1), Review of Last Therapy Session (1) and Review of Medication Regimen & Side  Effects (2)   Neita Garnet, MD 09/23/2014, 6:40 PM

## 2014-09-23 NOTE — Progress Notes (Signed)
Patient ID: [name] BorderRobert [name], male   DOB: [birth year], 24 y.o.   MRN: [id number]  DAR: Pt. Denies SI/HI and A/V Hallucinations to this Clinical research associatewriter. Patient states, "I feel good." Patient rates his depression, anxiety, and hopelessness at 0/10. Patient reports sleep is good, appetite is good, energy level is high, and concentration level is good. Patient does not report any pain or discomfort at this time. Support and encouragement provided to the patient. Scheduled medications administered to patient per physician's orders. No PRN medications necessary at this time. Patient is receptive and cooperative with Clinical research associatewriter but minimal. Patient is seen in the milieu at times and is attending some groups. Q15 minute checks are maintained for safety.

## 2014-09-23 NOTE — [facility] Group Notes (Signed)
[facility] Group Notes:  (Nursing/MHT/Case Management/Adjunct)  Date:  09/23/2014  Time:  10:23 AM  Type of Therapy:  Nurse Education  Participation Level:  Active  Participation Quality:  Attentive  Affect:  Appropriate  Cognitive:  Alert and Appropriate  Insight:  Improving  Engagement in Group:  Engaged  Modes of Intervention:  Discussion and Education  Summary of Progress/Problems: The purpose of this group was to discuss the topic of the day which was Recovery. Discharge and new orientation procedures were discussed as well. Patient came to group, was engaged and attentive. Patient appeared interested in the topic of discussion. Patient reported a short term goal was to keep taking his medications. A long term goal he has set for himself is to control his anger. Writer inquired how patient would do that and patient agreed that he would think before acting.   Marzetta BoardDopson, Hanny Elsberry E 09/23/2014, 10:23 AM

## 2014-09-23 NOTE — Plan of Care (Signed)
Problem: Ineffective individual coping Goal: STG: Patient will remain free from self harm Outcome: Progressing Patient remains free from self harm as evidenced by Q15 minute safety checks.     

## 2014-09-23 NOTE — [facility] Group Notes (Signed)
[facility] LCSW Group Therapy  09/23/2014 , 1:18 PM   Type of Therapy:  Group Therapy  Participation Level:  Active  Participation Quality:  Attentive  Affect:  Appropriate  Cognitive:  Alert  Insight:  Improving  Engagement in Therapy:  Engaged  Modes of Intervention:  Discussion, Exploration and Socialization  Summary of Progress/Problems: Today's group focused on the term Diagnosis.  Participants were asked to define the term, and then pronounce whether it is a negative, positive or neutral term.  [name] MaduroRobert initially declined to come to group.  Showed up with 10 minutes or so to go.  No meaningful interaction other than identifying parents and wife as supports.  [name] Geraldorth, [name] [name] B 09/23/2014 , 1:18 PM

## 2014-09-23 NOTE — Progress Notes (Signed)
Patient ID: [name] [name], male   DOB: April 15, 1991, 24 y.o.   MRN: [id number] D: Client visible on the unit, visits with parents this evening. Client reports "I'm ready to get back home to my wife, she's 6 feet 3, and can kick my ass up and down the highway, sometimes a man needs that" " I ain't hearing no voices and not seeing anything" Client reports admission has been helpful "occupying my time, being with people" Client reports plan for discharge "go the gymn, get up every morning like I do here, call support line if I need it, see my doctor for medication"  Client is animated and pleasant. A: Writer provided emotional support, encouraged client to consider group therapy after discharge as he notes it is beneficial. Staff will monitor q2415min for safety. R: Client is safe on the unit. Client plans to talk to SW about group therapy referrals.

## 2014-09-24 MED ORDER — CITALOPRAM HYDROBROMIDE 20 MG PO TABS: 20.0000 mg | ORAL_TABLET | Freq: Every day | ORAL | Status: DC

## 2014-09-24 MED ORDER — CLONAZEPAM 1 MG PO TABS: 1.0000 mg | ORAL_TABLET | Freq: Every day | ORAL | Status: DC

## 2014-09-24 MED ORDER — TRAZODONE HCL 50 MG PO TABS: 25.0000 mg | ORAL_TABLET | Freq: Every evening | ORAL | Status: DC | PRN

## 2014-09-24 MED ORDER — RISPERIDONE 2 MG PO TABS: 2.0000 mg | ORAL_TABLET | Freq: Every day | ORAL | Status: DC

## 2014-09-24 NOTE — Progress Notes (Signed)
Patient ID: Merton BorderRobert Scalise, male   DOB: 21-Nov-1990, 24 y.o.   MRN: [id number]  Pt. Denies SI/HI and A/V hallucinations. Belongings returned to patient at time of discharge. Patient denies any pain or discomfort. Discharge instructions and medications were reviewed with patient. Patient verbalized understanding of both medications and discharge instructions. Patient was discharged to lobby where his wife picked him up. No distress upon discharge. Q15 minute safety checks maintained until discharge. [facility]

## 2014-09-24 NOTE — Tx Team (Signed)
Interdisciplinary Treatment Plan Update (Adult)  Date:  09/24/2014   Time Reviewed:  11:04 AM   Progress in Treatment: Attending groups: Yes. Participating in groups:  Yes. Taking medication as prescribed:  Yes. Tolerating medication:  Yes. Family/Significant othe contact made:  Yes Patient understands diagnosis:  Yes   Discussing patient identified problems/goals with staff:  Yes, see initial care plan. Medical problems stabilized or resolved:  Yes. Denies suicidal/homicidal ideation: Yes. Issues/concerns per patient self-inventory:  No. Other:  New problem(s) identified:  Discharge Plan or Barriers: Return home, follow up with outpt providers  Reason for Continuation of Hospitalization:   Comments:  Estimated length of stay:  D/C today  New goal(s):  Review of initial/current patient goals per problem list:     Attendees: Patient:  09/24/2014 11:04 AM   Family:   09/24/2014 11:04 AM   Physician:  Nehemiah MassedFernando Cobos, MD 09/24/2014 11:04 AM   Nursing:   Marzetta Boardhrista Dopson, RN 09/24/2014 11:04 AM   CSW:    Daryel Geraldodney Tyeson [place]oto, LCSW   09/24/2014 11:04 AM   Other:  09/24/2014 11:04 AM   Other:   09/24/2014 11:04 AM   Other:  Onnie BoerJennifer Clark, Nurse CM 09/24/2014 11:04 AM   Other:  Leisa LenzValerie Enoch, [facility] 09/24/2014 11:04 AM   Other:  Tomasita Morrowelora Sutton, P4CC  09/24/2014 11:04 AM   Other:  09/24/2014 11:04 AM   Other:  09/24/2014 11:04 AM   Other:  09/24/2014 11:04 AM   Other:  09/24/2014 11:04 AM   Other:  09/24/2014 11:04 AM   Other:   09/24/2014 11:04 AM    Scribe for Treatment Team:   Daryel GeraldNorth, Meagan Spease B, 09/24/2014 11:04 AM

## 2014-09-24 NOTE — BHH Suicide Risk Assessment (Signed)
[facility]   Demographic Factors:  24 year old male, lives with wife, currently unemployed   Total Time spent with patient: 30 minutes  Musculoskeletal: Strength & Muscle Tone: within normal limits Gait & Station: normal Patient leans: N/A  Psychiatric Specialty Exam: Physical Exam  ROS  Blood pressure 119/71, pulse 57, temperature 97.7 F (36.5 C), temperature source Oral, resp. rate 20, height 5\' 11"  (1.803 m), weight 192 lb (87.091 kg).Body mass index is 26.79 kg/(m^2).  General Appearance: improved grooming  Eye Contact::  Good  Speech:  Normal Rate409  Volume:  Normal  Mood:  Euthymic- denies depression  Affect:  Appropriate  Thought Process:  Linear  Orientation:  Full (Time, Place, and Person)  Thought Content:  no hallucinations, no delusions - not internally preoccupied   Suicidal Thoughts:  No- denies any thoughts of wanting to hurt self or anyone else- specifically also denies any HI or violent ideations towards wife   Homicidal Thoughts:  No  Memory:  recent and remote grossly intact   Judgement:  Other:  improved   Insight:  Fair  Psychomotor Activity:  Normal  Concentration:  Good  Recall:  Good  Fund of Knowledge:Good  Language: Good  Akathisia:  Negative  Handed:  Right  AIMS (if indicated):     Assets:  Desire for Improvement Physical Health Resilience Social Support  Sleep:  Number of Hours: 6.75  Cognition: WNL  ADL's: improved    Have you used any form of tobacco in the last 30 days? (Cigarettes, Smokeless Tobacco, Cigars, and/or Pipes): No  Has this patient used any form of tobacco in the last 30 days? (Cigarettes, Smokeless Tobacco, Cigars, and/or Pipes) No  Mental Status Per Nursing Assessment::   On Admission:  Suicidal ideation indicated by patient, Suicide plan, Plan includes specific time, place, or method, Intention to act on suicide plan, Belief that plan would result in death  Current Mental Status by  Physician: Patient presents with improved mood and affect- denies depression, behavior on unit has been calm and in good control, no suicidal ideations, also denies any homicidal or violent ideations, to include having no thoughts of violence or HI towards wife. He is denying psychotic symptoms and does not appear internally preoccupied. 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  Risk Reduction Factors:   Living with another person, especially a relative, Positive social support and Positive coping skills or problem solving skills  Continued Clinical Symptoms:  As noted, improved compared to admission, and reporting mood as stable, euthymic at this time. No SI or HI, no psychotic symptoms, behavior on unit calm and in good control. Of note, with patient's express consent attempted to call his wife, who he states will be picking him up later today. No answer at phone given. SW has spoken with wife and she is aware of his discharge plan for  today. Also, I have with patient's express consent called Dr. Odessa FlemingFarra , who is his outpatient psychiatrist with whom he will be following. Left message for Dr. Odessa FlemingFarra to return call.  Cognitive Features That Contribute To Risk:  No gross cognitive deficits noted upon discharge. Is alert , attentive, and oriented x 3  Suicide Risk:  Mild:  Suicidal ideation of limited frequency, intensity, duration, and  specificity.  There are no identifiable plans, no associated intent, mild dysphoria and related symptoms, good self-control (both objective and subjective assessment), few other risk factors, and identifiable protective factors, including available and accessible social support.  Principal Problem: Bipolar I disorder, most recent episode  depressed Discharge Diagnoses:  Patient Active Problem List   Diagnosis Date Noted  . Bipolar I disorder, most recent episode depressed [F31.30] 09/21/2014  . Depression [F32.9] 09/19/2014    Follow-up Information    Follow up with [facility] On 10/01/2014.   Why:  Next Wednesday at 1:30 with Dr Mason JimFarrah   Contact information:   [address]  [place]  [336] [phone]      [facility] On 09/26/2014.   Why:  This Friday with Truitt Leepraig Smith at 1:00    Contact information:   [address]  [place]  [336] [phone]      Plan Of Care/Follow-up recommendations:  Activity:  as tolerated Diet:  Regular Tests:  NA Other:  See below  Is patient on multiple antipsychotic therapies at discharge:  No   Has Patient had three or more failed trials of antipsychotic monotherapy by history:  No  Recommended Plan for Multiple Antipsychotic Therapies: NA   Patient is leaving in good spirits. States wife will be [picking him up later today- plans to return home. Plans to follow up as above . 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     Ondria Oswald 09/24/2014, 11:21 AM

## 2014-09-24 NOTE — Plan of Care (Signed)
Problem: Diagnosis: Increased Risk For Suicide Attempt Goal: LTG-Patient Will Report Improvement in Psychotic Symptoms LTG (by discharge) : Patient will report improvement in psychotic symptoms.  Outcome: Progressing Client is safe on the unit, AEB q7215min safety checks, denies SHI, reports plan to continue OP tx.

## 2014-09-24 NOTE — Progress Notes (Signed)
  [facility] :  Will you be returning to the same living situation after discharge:  Yes,  home At discharge, do you have transportation home?: Yes,  family Do you have the ability to pay for your medications: Yes,  insurance  Release of information consent forms completed and in the chart;  Patient's signature needed at discharge.  Patient to Follow up at: Follow-up Information    Follow up with [facility] On 10/01/2014.   Why:  Next Wednesday at 1:30 with Dr Mason JimFarrah   Contact information:   [address]  [place]  [336] [phone]      [facility].   Why:  This Friday with Truitt Leepraig Smith at 1:00    Contact information:   [address]  [place]  [336] [phone]      Patient denies SI/HI: Yes,  yes    Aeronautical engineerafety Planning and Suicide Prevention discussed: Yes,  yes  Have you used any form of tobacco in the last 30 days? (Cigarettes, Smokeless Tobacco, Cigars, and/or Pipes): No  Has patient been referred to the Quitline?: N/A patient is not a smoker  [name], [name] [name] 09/24/2014, 11:03 AM

## 2014-09-24 NOTE — Discharge Summary (Signed)
Physician Discharge Summary Note  Patient:  [name] [name] is an 24 y.o., male MRN:  [id number] DOB:  February 02, 1991 Patient phone:  [phone] (home)  Patient address:   [address] [place] [id number],  Total Time spent with patient: 30 minutes  Date of Admission:  09/19/2014 Date of Discharge: 09/24/2014  Reason for Admission:  Psychosis  Principal Problem: Bipolar I disorder, most recent episode depressed Discharge Diagnoses: Patient Active Problem List   Diagnosis Date Noted  . Bipolar I disorder, most recent episode depressed [F31.30] 09/21/2014  . Depression [F32.9] 09/19/2014   Musculoskeletal: Strength & Muscle Tone: within normal limits Gait & Station: normal Patient leans: N/A  Psychiatric Specialty Exam: Physical Exam  Vitals reviewed.   Review of Systems  All other systems reviewed and are negative.   Blood pressure 119/71, pulse 57, temperature 97.7 F (36.5 C), temperature source Oral, resp. rate 20, height 5\' 11"  (1.803 m), weight 87.091 kg (192 lb).Body mass index is 26.79 kg/(m^2).  Have you used any form of tobacco in the last 30 days? (Cigarettes, Smokeless Tobacco, Cigars, and/or Pipes): No  Has this patient used any form of tobacco in the last 30 days? (Cigarettes, Smokeless Tobacco, Cigars, and/or Pipes) Yes, A prescription for an FDA-approved tobacco cessation medication was offered at discharge and the patient refused  Past Medical History:  Past Medical History  Diagnosis Date  . Bipolar 1 disorder    History reviewed. No pertinent past surgical history. Family History: History reviewed. No pertinent family history. Social History:  History  Alcohol Use  . Yes     History  Drug Use No    History   Social History  . Marital Status: Single    Spouse Name: N/A  . Number of Children: N/A  . Years of Education: N/A   Social History Main Topics  . Smoking status: Never Smoker   . Smokeless tobacco: Not on file  . Alcohol Use: Yes  .  Drug Use: No  . Sexual Activity: Not on file   Other Topics Concern  . None   Social History Narrative   Risk to Self: Is patient at risk for suicide?: Yes What has been your use of drugs/alcohol within the last 12 months?: Patient reports he is a 'social drinker'; last two occassions for alcohol use were Christmas and recent wedding; no other reportd substance use Risk to Others:   Prior Inpatient Therapy:   Prior Outpatient Therapy:    Level of Care:  OP  Hospital Course:   [name] BorderRobert [name] is an 24 y.o. Male [facility] for suicidal ideations and threatening homicidal to his wife and also reporting, landing auditory hallucinations. Patient reported his wife telling him to harm himself and his wife. Patient plan to harm himself consists of taking a police officer's gun and shooting himself. Patient has been diagnosed with depression, Bipolar, and Anxiety. Patient reportedly taking outpatient medication management with risperidone, Klonopin and Celexa.   [name] [name] was admitted for Bipolar I disorder, most recent episode depressed and crisis management.  He was treated discharged with the medications listed below under Medication List.  Medical problems were identified and treated as needed.  Home medications were restarted as appropriate.  Improvement was monitored by observation and [name] [name] daily report of symptom reduction.  Emotional and mental status was monitored by daily self-inventory reports completed by [name] [name] and clinical staff.  [name] [name] was evaluated by the treatment team for stability and plans for continued recovery upon discharge.  [name] [name] motivation was an integral factor for scheduling further treatment.  Employment, transportation, bed availability, health status, family support, and any pending legal issues were also considered during his hospital stay.  He was offered  further treatment options upon discharge including but not limited to Residential, Intensive Outpatient, and Outpatient treatment.  [name] [name] will follow up with the services as listed below under Follow Up Information.     Upon completion of this admission the patient was both mentally and medically stable for discharge denying suicidal/homicidal ideation, auditory/visual/tactile hallucinations, delusional thoughts and paranoia.      Consults:  psychiatry  Significant Diagnostic Studies:  labs: per ED  Discharge Vitals:   Blood pressure 119/71, pulse 57, temperature 97.7 F (36.5 C), temperature source Oral, resp. rate 20, height  (1.803 m), weight 87.091 kg (192 lb). Body mass index is 26.79 kg/(m^2). Lab Results:   No results found for this or any previous visit (from the past 72 hour(s)).  Physical Findings: AIMS: Facial and Oral Movements Muscles of Facial Expression: None, normal Lips and Perioral Area: None, normal Jaw: None, normal Tongue: None, normal,Extremity Movements Upper (arms, wrists, hands, fingers): None, normal Lower (legs, knees, ankles, toes): None, normal, Trunk Movements Neck, shoulders, hips: None, normal, Overall Severity Severity of abnormal movements (highest score from questions above): None, normal Incapacitation due to abnormal movements: None, normal Patient's awareness of abnormal movements (rate only patient's report): No Awareness, Dental Status Current problems with teeth and/or dentures?: No Does patient usually wear dentures?: No  CIWA:    COWS:      See Psychiatric Specialty Exam and Suicide Risk Assessment completed by Attending Physician prior to discharge.  Discharge destination:  Home  Is patient on multiple antipsychotic therapies at discharge:  No   Has Patient had three or more failed trials of antipsychotic monotherapy by history:  No    Recommended Plan for Multiple Antipsychotic Therapies: NA     Medication List     STOP taking these medications        valACYclovir 500 MG tablet  Commonly known as:  VALTREX      TAKE these medications      Indication   citalopram 20 MG tablet  Commonly known as:  CELEXA  Take 1 tablet (20 mg total) by mouth daily.   Indication:  Depression     clonazePAM 1 MG tablet  Commonly known as:  KLONOPIN  Take 1 tablet (1 mg total) by mouth at bedtime.   Indication:  anxiety     risperiDONE 2 MG tablet  Commonly known as:  RISPERDAL  Take 1 tablet (2 mg total) by mouth daily.   Indication:  mood stabilization     traZODone 50 MG tablet  Commonly known as:  DESYREL  Take 0.5 tablets (25 mg total) by mouth at bedtime as needed for sleep.   Indication:  Trouble Sleeping           Follow-up Information    Follow up with [facility] On 10/01/2014.   Why:  Next Wednesday at 1:30 with Dr Mason Jim information:   [address]  [place]  [336] [phone]      Follow up with [facility] On 09/26/2014.   Why:  This Friday with Truitt Leep at 1:00    Contact information:   [address]  [place]  [336] [phone]      Follow-up recommendations:  Activity:  as tol, diet as tol  Comments:  1.  Take all your medications as prescribed.              2.  Report any adverse side effects to outpatient provider.                       3.  Patient instructed to not use alcohol or illegal drugs while on prescription medicines.            4.  In the event of worsening symptoms, instructed patient to call 911, the crisis hotline or go to nearest emergency room for evaluation of symptoms.  Total Discharge Time: 30 min  Signed: Velna HatchetSheila May Agustin AGNP-BC 09/24/2014, 6:17 PM   Patient seen, Suicide Assessment Completed.  Disposition Plan Reviewed

## 2015-11-23 ENCOUNTER — Encounter (HOSPITAL_COMMUNITY): Payer: Self-pay | Admitting: *Deleted

## 2015-11-23 ENCOUNTER — Emergency Department (HOSPITAL_COMMUNITY)
Admission: EM | Admit: 2015-11-23 | Discharge: 2015-11-23 | Disposition: A | Discharge status: Home / Self Care | Payer: BLUE CROSS/BLUE SHIELD | Attending: Emergency Medicine | Admitting: Emergency Medicine

## 2015-11-23 DIAGNOSIS — M5416 Radiculopathy, lumbar region: Secondary | ICD-10-CM | POA: Insufficient documentation

## 2015-11-23 DIAGNOSIS — F319 Bipolar disorder, unspecified: Secondary | ICD-10-CM | POA: Diagnosis not present

## 2015-11-23 DIAGNOSIS — M545 Low back pain: Secondary | ICD-10-CM | POA: Diagnosis present

## 2015-11-23 MED ORDER — METHOCARBAMOL 500 MG PO TABS
500.0000 mg | ORAL_TABLET | Freq: Once | ORAL | Status: AC
  Administered 2015-11-23: 500 mg via ORAL
  Filled 2015-11-23: qty 1

## 2015-11-23 MED ORDER — KETOROLAC TROMETHAMINE 10 MG PO TABS: 10.0000 mg | ORAL_TABLET | Freq: Four times a day (QID) | ORAL | Status: DC | PRN

## 2015-11-23 MED ORDER — KETOROLAC TROMETHAMINE 60 MG/2ML IM SOLN
30.0000 mg | Freq: Once | INTRAMUSCULAR | Status: AC
  Administered 2015-11-23: 30 mg via INTRAMUSCULAR
  Filled 2015-11-23: qty 2

## 2015-11-23 MED ORDER — METHOCARBAMOL 500 MG PO TABS: 1000.0000 mg | ORAL_TABLET | Freq: Four times a day (QID) | ORAL | Status: DC | PRN

## 2015-11-23 NOTE — Discharge Instructions (Signed)
Do not combine ketorolac (toradol) with any other NSAID (motrin, ibuprofen, Advil, aleve , aspirin, naproxen etc.) Take fist ketorolac pill tomorrow, you have already had a shot today  For breakthrough pain you may take Robaxin. Do not drink alcohol, drive or operate heavy machinery when taking Robaxin.  Please follow with your primary care doctor in the next 2 days for a check-up. They must obtain records for further management.   Do not hesitate to return to the Emergency Department for any new, worsening or concerning symptoms.    Lumbosacral Radiculopathy Lumbosacral radiculopathy is a condition that involves the spinal nerves and nerve roots in the low back and bottom of the spine. The condition develops when these nerves and nerve roots move out of place or become inflamed and cause symptoms. CAUSES This condition may be caused by:  Pressure from a disk that bulges out of place (herniated disk). A disk is a plate of cartilage that separates bones in the spine.  Disk degeneration.  A narrowing of the bones of the lower back (spinal stenosis).  A tumor.  An infection.  An injury that places sudden pressure on the disks that cushion the bones of your lower spine. RISK FACTORS This condition is more likely to develop in:  Males aged 30-50 years.  Females aged 50-60 years.  People who lift improperly.  People who are overweight or live a sedentary lifestyle.  People who smoke.  People who perform repetitive activities that strain the spine. SYMPTOMS Symptoms of this condition include:  Pain that goes down from the back into the legs (sciatica). This is the most common symptom. The pain may be worse with sitting, coughing, or sneezing.  Pain and numbness in the arms and legs.  Muscle weakness.  Tingling.  Loss of bladder control or bowel control. DIAGNOSIS This condition is diagnosed with a physical exam and medical history. If the pain is lasting, you may have  tests, such as:  MRI scan.  X-ray.  CT scan.  Myelogram.  Nerve conduction study. TREATMENT This condition is often treated with:  Hot packs and ice applied to affected areas.  Stretches to improve flexibility.  Exercises to strengthen back muscles.  Physical therapy.  Pain medicine.  A steroid injection in the spine. In some cases, no treatment is needed. If the condition is long-lasting (chronic), or if symptoms are severe, treatment may involve surgery or lifestyle changes, such as following a weight loss plan. HOME CARE INSTRUCTIONS Medicines  Take medicines only as directed by your health care provider.  Do not drive or operate heavy machinery while taking pain medicine. Injury Care  Apply a heat pack to the injured area as directed by your health care provider.  Apply ice to the affected area:  Put ice in a plastic bag.  Place a towel between your skin and the bag.  Leave the ice on for 20-30 minutes, every 2 hours while you are awake or as needed. Or, leave the ice on for as long as directed by your health care provider. Other Instructions  If you were shown how to do any exercises or stretches, do them as directed by your health care provider.  If your health care provider prescribed a diet or exercise program, follow it as directed.  Keep all follow-up visits as directed by your health care provider. This is important. SEEK MEDICAL CARE IF:  Your pain does not improve over time even when taking pain medicines. SEEK IMMEDIATE MEDICAL CARE IF:  Your develop severe pain.  Your pain suddenly gets worse.  You develop increasing weakness in your legs.  You lose the ability to control your bladder or bowel.  You have difficulty walking or balancing.  You have a fever.   This information is not intended to replace advice given to you by your health care provider. Make sure you discuss any questions you have with your health care provider.     Document Released: 05/09/2005 Document Revised: 09/23/2014 Document Reviewed: 05/05/2014 Elsevier Interactive Patient Education [employer]2016 Elsevier Inc.

## 2015-11-23 NOTE — ED Notes (Signed)
PT DISCHARGED. INSTRUCTIONS AND PRESCRIPTIONS GIVEN. AAOX4. PT IN NO APPARENT DISTRESS. THE OPPORTUNITY TO ASK QUESTIONS WAS PROVIDED. 

## 2015-11-23 NOTE — ED Provider Notes (Signed)
CSN: [id number]     Arrival date & time 11/23/15  1308 History  By signing my name below, I, Alyssa GroveMartin Green, attest that this documentation has been prepared under the direction and in the presence of [employer] Jadene Stemmer, PA. Electronically Signed: Alyssa GroveMartin Green, ED Scribe. 11/23/2015. 1:20 PM.   Chief Complaint  Patient presents with  . Back Pain    The history is provided by the patient. No language interpreter was used.    HPI Comments: Merton BorderRobert Klepacki is a 25 y.o. male with PMHx of Sciatica who presents to the Emergency Department complaining of moderate back lower pain onset October 2016. Pt reports associated vomiting from severity of pain. Pt has been using Aleve. Pt reports pain is exacerbated while walking. Pt denies PMHx of Cancer or IV drug abuse. Pt denies fevers or incontinence to urine or stool. Pt has PCP who is aware of condition.   Past Medical History  Diagnosis Date  . Bipolar 1 disorder (HCC)    History reviewed. No pertinent past surgical history. No family history on file. Social History  Substance Use Topics  . Smoking status: Never Smoker   . Smokeless tobacco: None  . Alcohol Use: Yes    Review of Systems A complete 10 system review of systems was obtained and all systems are negative except as noted in the HPI and PMH.    Allergies  Bee venom  Home Medications   Prior to Admission medications   Medication Sig Start Date End Date Taking? Authorizing Provider  citalopram (CELEXA) 20 MG tablet Take 1 tablet (20 mg total) by mouth daily. 09/24/14   Adonis BrookSheila Agustin, NP  clonazePAM (KLONOPIN) 1 MG tablet Take 1 tablet (1 mg total) by mouth at bedtime. 09/24/14   Adonis BrookSheila Agustin, NP  risperiDONE (RISPERDAL) 2 MG tablet Take 1 tablet (2 mg total) by mouth daily. 09/24/14   Adonis BrookSheila Agustin, NP  traZODone (DESYREL) 50 MG tablet Take 0.5 tablets (25 mg total) by mouth at bedtime as needed for sleep. 09/24/14   Adonis BrookSheila Agustin, NP   BP 115/70 mmHg  Pulse 54  Temp(Src) 97.8 F  (36.6 C) (Oral)  Resp 18  SpO2 99% Physical Exam  Constitutional: He appears well-developed and well-nourished.  HENT:  Head: Normocephalic.  Eyes: Conjunctivae are normal.  Neck: Normal range of motion.  Cardiovascular: Normal rate, regular rhythm and intact distal pulses.   Pulmonary/Chest: Effort normal.  Abdominal: Soft. There is no tenderness.  Neurological: He is alert.  No point tenderness to percussion of lumbar spinal processes.  No TTP or paraspinal muscular spasm. Strength is 5 out of 5 to bilateral lower extremities at hip and knee; extensor hallucis longus 5 out of 5. Ankle strength 5 out of 5, no clonus, neurovascularly intact. No saddle anaesthesia. Patellar reflexes are 2+ bilaterally.    Straight leg raise is positive on the right side at 45, negative on the contralateral (left) side   Psychiatric: He has a normal mood and affect.  Nursing note and vitals reviewed.   ED Course  Procedures (including critical care time)  DIAGNOSTIC STUDIES: Oxygen Saturation is 99% on RA, normal by my interpretation.      Labs Review Labs Reviewed - No data to display  Imaging Review No results found. I have personally reviewed and evaluated these images and lab results as part of my medical decision-making.   EKG Interpretation None      MDM   Final diagnoses:  Lumbar radiculopathy, acute    Filed  Vitals:   11/23/15 1313  BP: 115/70  Pulse: 54  Temp: 97.8 F (36.6 C)  TempSrc: Oral  Resp: 18  SpO2: 99%    Medications  ketorolac (TORADOL) injection 30 mg (not administered)  methocarbamol (ROBAXIN) tablet 500 mg (not administered)    Merton BorderRobert Viglione is 25 y.o. male presenting with back pain.  No neurological deficits and normal neuro exam.  Patient can walk but states is painful.  No loss of bowel or bladder control.  No concern for cauda equina.  No fever, night sweats, weight loss, h/o cancer, IVDU.  RICE protocol and pain medicine indicated and discussed  with patient.  Evaluation does not show pathology that would require ongoing emergent intervention or inpatient treatment. Pt is hemodynamically stable and mentating appropriately. Discussed findings and plan with patient/guardian, who agrees with care plan. All questions answered. Return precautions discussed and outpatient follow up given.   New Prescriptions   KETOROLAC (TORADOL) 10 MG TABLET    Take 1 tablet (10 mg total) by mouth every 6 (six) hours as needed (Take with food. Do not take more than 4 per day. Do not take for longer than 5 days).   METHOCARBAMOL (ROBAXIN) 500 MG TABLET    Take 2 tablets (1,000 mg total) by mouth 4 (four) times daily as needed (Pain).     I personally performed the services described in this documentation, which was scribed in my presence. The recorded information has been reviewed and is accurate.    Wynetta Emeryicole Marget Outten, PA-C 11/23/15 1351  Lorre NickAnthony Allen, MD 11/23/15 1747

## 2015-11-23 NOTE — ED Notes (Addendum)
Pt complains of right knee pain radiating to his lower back for the past 4 weeks, which is worse today. Pt states he had similar episode last year and was told he had sciatica. Pt has tried aleve with minimal relief.

## 2016-01-07 ENCOUNTER — Encounter (HOSPITAL_COMMUNITY): Payer: Self-pay

## 2016-01-07 ENCOUNTER — Emergency Department (HOSPITAL_COMMUNITY)
Admission: EM | Admit: 2016-01-07 | Discharge: 2016-01-07 | Disposition: A | Discharge status: Home / Self Care | Payer: BLUE CROSS/BLUE SHIELD | Attending: Emergency Medicine | Admitting: Emergency Medicine

## 2016-01-07 DIAGNOSIS — F10120 Alcohol abuse with intoxication, uncomplicated: Secondary | ICD-10-CM | POA: Insufficient documentation

## 2016-01-07 DIAGNOSIS — Z79899 Other long term (current) drug therapy: Secondary | ICD-10-CM | POA: Diagnosis not present

## 2016-01-07 DIAGNOSIS — F10920 Alcohol use, unspecified with intoxication, uncomplicated: Secondary | ICD-10-CM

## 2016-01-07 HISTORY — DX: Post-traumatic stress disorder, unspecified: F43.10

## 2016-01-07 LAB — ETHANOL: ALCOHOL ETHYL (B): 170 mg/dL — AB (ref <5)

## 2016-01-07 MED ORDER — PANTOPRAZOLE SODIUM 40 MG PO TBEC
40.0000 mg | DELAYED_RELEASE_TABLET | Freq: Once | ORAL | Status: AC
  Administered 2016-01-07: 40 mg via ORAL
  Filled 2016-01-07: qty 1

## 2016-01-07 MED ORDER — SODIUM CHLORIDE 0.9 % IV BOLUS (SEPSIS)
1000.0000 mL | Freq: Once | INTRAVENOUS | Status: AC
  Administered 2016-01-07: 1000 mL via INTRAVENOUS

## 2016-01-07 MED ORDER — ONDANSETRON HCL 4 MG/2ML IJ SOLN
4.0000 mg | Freq: Once | INTRAMUSCULAR | Status: AC
  Administered 2016-01-07: 4 mg via INTRAVENOUS
  Filled 2016-01-07: qty 2

## 2016-01-07 NOTE — ED Provider Notes (Signed)
[facility]-EMERGENCY DEPT Provider Note   CSN: [id number] Arrival date & time: 01/07/16  0157     History   Chief Complaint Chief Complaint  Patient presents with  . Alcohol Intoxication    drank about 9 shots of vodka since 1800pm last night    HPI Level V caveat: Alcohol intoxication [name] BorderRobert [name] is a 25 y.o. male brought from a bar for severe alcohol intoxication after reportedly drinking 9 shots of vodka since 6 PM yesterday evening. He has had nausea and vomiting PTA and was given IV fluid bolus and Zofran IV prior to arrival. On arrival he is unable to give a history but is able to deny pain.  HPI  Past Medical History:  Diagnosis Date  . Bipolar 1 disorder (HCC)   . PTSD (post-traumatic stress disorder)     Patient Active Problem List   Diagnosis Date Noted  . Bipolar I disorder, most recent episode depressed (HCC) 09/21/2014  . Depression 09/19/2014    History reviewed. No pertinent surgical history.     Home Medications    Prior to Admission medications   Medication Sig Start Date End Date Taking? Authorizing Provider  benztropine (COGENTIN) 0.5 MG tablet Take 0.5 mg by mouth 2 (two) times daily. 12/15/15 12/14/16 Yes Historical Provider, MD  citalopram (CELEXA) 20 MG tablet Take 1 tablet (20 mg total) by mouth daily. 09/24/14  Yes Adonis BrookSheila Agustin, NP  clonazePAM (KLONOPIN) 1 MG tablet Take 1 tablet (1 mg total) by mouth at bedtime. Patient not taking: Reported on 01/07/2016 09/24/14   Adonis BrookSheila Agustin, NP  ketorolac (TORADOL) 10 MG tablet Take 1 tablet (10 mg total) by mouth every 6 (six) hours as needed (Take with food. Do not take more than 4 per day. Do not take for longer than 5 days). Patient not taking: Reported on 01/07/2016 11/23/15   Joni ReiningNicole Pisciotta, PA-C  methocarbamol (ROBAXIN) 500 MG tablet Take 2 tablets (1,000 mg total) by mouth 4 (four) times daily as needed (Pain). Patient not taking: Reported on 01/07/2016 11/23/15   Joni ReiningNicole Pisciotta, PA-C  risperiDONE  (RISPERDAL) 2 MG tablet Take 1 tablet (2 mg total) by mouth daily. Patient not taking: Reported on 01/07/2016 09/24/14   Adonis BrookSheila Agustin, NP  traZODone (DESYREL) 50 MG tablet Take 0.5 tablets (25 mg total) by mouth at bedtime as needed for sleep. Patient not taking: Reported on 01/07/2016 09/24/14   Adonis BrookSheila Agustin, NP    Family History History reviewed. No pertinent family history.  Social History Social History  Substance Use Topics  . Smoking status: Never Smoker  . Smokeless tobacco: Never Used  . Alcohol use 5.4 oz/week    9 Shots of liquor per week     Allergies   Bee venom   Review of Systems Review of Systems   Physical Exam Updated Vital Signs BP 108/64 (BP Location: Right Arm)   Pulse 79   Temp 97.5 F (36.4 C) (Oral)   Resp 18   Ht 5\' 10"  (1.778 m)   Wt 185 lb (83.9 kg)   SpO2 98%   BMI 26.54 kg/m   Physical Exam General: Well-developed, well-nourished male in no acute distress; appearance consistent with age of record HENT: normocephalic; atraumatic; breath smells of alcohol Eyes: pupils equal, round and reactive to light Neck: supple Heart: regular rate and rhythm Lungs: clear to auscultation bilaterally Abdomen: soft; nondistended; nontender; no masses or hepatosplenomegaly; bowel sounds present Extremities: No deformity; full range of motion; pulses normal Neurologic: Somnolent but will arouse  to voice; dysarthria; ataxia motor function intact in all extremities and symmetric Skin: Warm and dry Psychiatric: Intoxicated    ED Treatments / Results  Nursing notes and vitals signs, including pulse oximetry, reviewed.  Summary of this visit's results, reviewed by myself:  Labs:  Results for orders placed or performed during the hospital encounter of 01/07/16 (from the past 24 hour(s))  Ethanol     Status: Abnormal   Collection Time: 01/07/16  2:11 AM  Result Value Ref Range   Alcohol, Ethyl (B) 170 (H) <5 mg/dL    0:986:38 AM Patient now awake and  alert. Able to ambulate without difficulty.  Procedures (including critical care time)   Final Clinical Impressions(s) / ED Diagnoses   Final diagnoses:  Alcohol intoxication, uncomplicated (HCC)      Paula LibraJohn Melesa Lecy, MD 01/07/16 [phone]

## 2016-01-07 NOTE — ED Triage Notes (Signed)
Drank 9 shots of vodka since 1800 pm last night with N/V

## 2016-01-07 NOTE — ED Notes (Signed)
Drinking water without vomiting.

## 2016-01-07 NOTE — ED Notes (Signed)
Place pt on O2 due to sats 92% Dr Read DriversMolpus notified of event.

## 2016-01-07 NOTE — ED Notes (Signed)
Bed: [id number] Expected date:  Expected time:  Means of arrival:  Comments: EMS intoxicated

## 2016-01-07 NOTE — ED Notes (Signed)
No respiratory or acute distress noted resting in bed with eyes closed awakens with verbal stimuli and talks with garbled speech, call light in reach no reaction to medication noted.

## 2018-04-10 ENCOUNTER — Encounter: Payer: Self-pay | Admitting: Emergency Medicine

## 2018-04-10 DIAGNOSIS — F902 Attention-deficit hyperactivity disorder, combined type: Secondary | ICD-10-CM | POA: Insufficient documentation

## 2018-04-10 DIAGNOSIS — R454 Irritability and anger: Secondary | ICD-10-CM

## 2018-04-11 ENCOUNTER — Ambulatory Visit: Payer: BLUE CROSS/BLUE SHIELD | Admitting: Psychiatry

## 2018-04-11 DIAGNOSIS — F4310 Post-traumatic stress disorder, unspecified: Secondary | ICD-10-CM | POA: Diagnosis not present

## 2018-04-11 DIAGNOSIS — F310 Bipolar disorder, current episode hypomanic: Secondary | ICD-10-CM | POA: Diagnosis not present

## 2018-04-11 DIAGNOSIS — F1210 Cannabis abuse, uncomplicated: Secondary | ICD-10-CM | POA: Insufficient documentation

## 2018-04-11 DIAGNOSIS — F3130 Bipolar disorder, current episode depressed, mild or moderate severity, unspecified: Secondary | ICD-10-CM

## 2018-04-11 DIAGNOSIS — T1491XA Suicide attempt, initial encounter: Secondary | ICD-10-CM

## 2018-04-11 DIAGNOSIS — T1491XD Suicide attempt, subsequent encounter: Secondary | ICD-10-CM

## 2018-04-11 MED ORDER — TOPIRAMATE 25 MG PO TABS: 25.0000 mg | ORAL_TABLET | Freq: Two times a day (BID) | ORAL | 1 refills | Status: DC

## 2018-04-11 MED ORDER — DIVALPROEX SODIUM ER 500 MG PO TB24: ORAL_TABLET | ORAL | 1 refills | Status: DC

## 2018-04-11 MED ORDER — RISPERIDONE 3 MG PO TABS: 3.0000 mg | ORAL_TABLET | Freq: Every day | ORAL | 1 refills | Status: DC

## 2018-04-11 MED ORDER — MIRTAZAPINE 15 MG PO TABS: 7.5000 mg | ORAL_TABLET | Freq: Every day | ORAL | 1 refills | Status: DC

## 2018-04-11 NOTE — Progress Notes (Signed)
[facility]  Patient ID: Merton BorderRobert Coil,  MRN: [id number]  PCP: Patient, No Pcp Per  Date of Evaluation: 04/11/2018 Time spent:20 minutes  Chief Complaint:   HISTORY/CURRENT STATUS: HPI 27 year old white male last seen 02/14/2018. Increased irritability so increased depakote er 500mg  to 5/day. Recently he has been laid off work for 2 to 3 weeks.  He is having worsening anger with grandiosity increased talking decreased sleep and impulsiveness.  Anxiety is worse but no panic attacks.  Does have depression with decreased motivation decreased eating but no crying and no suicidal thoughts.  He does have dreams past trauma several times a month.   Individual Medical History/ Review of Systems: Changes? :3 suicide attempts in past, most recently 2 years ago. declines lithium  Allergies: Bee venom  Current Medications:  Current Outpatient Medications:  .  divalproex (DEPAKOTE ER) 500 MG 24 hr tablet, 2 tabs tid, Disp: 180 tablet, Rfl: 1 .  mirtazapine (REMERON) 15 MG tablet, Take 0.5 tablets (7.5 mg total) by mouth at bedtime. 1/2 hs, Disp: 15 tablet, Rfl: 1 .  risperiDONE (RISPERDAL) 3 MG tablet, Take 1 tablet (3 mg total) by mouth at bedtime., Disp: 30 tablet, Rfl: 1 .  topiramate (TOPAMAX) 25 MG tablet, Take 1 tablet (25 mg total) by mouth 2 (two) times daily. 2/d, Disp: 60 tablet, Rfl: 1 .  benztropine (COGENTIN) 0.5 MG tablet, Take 0.5 mg by mouth 2 (two) times daily., Disp: , Rfl:  .  citalopram (CELEXA) 20 MG tablet, Take 1 tablet (20 mg total) by mouth daily. (Patient not taking: Reported on 04/11/2018), Disp: 30 tablet, Rfl: 0 Medication Side Effects: none  Family Medical/ Social History: Changes? Lost job  MENTAL HEALTH EXAM:  There were no vitals taken for this visit.There is no height or weight on file to calculate BMI.  General Appearance: Casual  Eye Contact:  Good  Speech:  Normal Rate  Volume:  Normal  Mood:  Depressed irritable  Affect:  Appropriate   Thought Process:  Linear  Orientation:  Full (Time, Place, and Person)  Thought Content: Logical   Suicidal Thoughts:  No  Homicidal Thoughts:  No  Memory:  WNL  Judgement:  ok  Insight:  ok  Psychomotor Activity:  Normal  Concentration:  Concentration: Good  Recall:  Good  Fund of Knowledge: Good  Language: Good  Assets:  Desire for Improvement  ADL's:  Intact  Cognition: WNL  Prognosis:  Good    DIAGNOSES:    ICD-10-CM   1. Bipolar I disorder, most recent episode depressed (HCC) F31.30   2. Bipolar affective disorder, current episode hypomanic (HCC) F31.0   3. PTSD (post-traumatic stress disorder) F43.10     Receiving Psychotherapy: No    RECOMMENDATIONS: Increase his Depakote ER.  Currently takes 5 tablets at night.  He is going to increase by 1 tablet and I want him to take 2 tablets 3 times daily.  We are going to increase his Risperdal to 3 mg a day to help with anxiety.  Continue Topamax 25 mg 2 a day and increase Remeron 15 mg 1 a day.  Turn in 2 weeks.   Anne Fulay Taquilla Downum, PA-C

## 2018-04-12 ENCOUNTER — Telehealth: Payer: Self-pay | Admitting: Psychiatry

## 2018-04-12 NOTE — Telephone Encounter (Signed)
Pt called and wanted return call. Crisis but not suicidal. Called and got answering machine. Left msg.  Pt to call on call if not doing well or can wait and call me tomorrow, or wait for my call.

## 2018-04-13 ENCOUNTER — Telehealth: Payer: Self-pay | Admitting: Psychiatry
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
## 2018-04-30 ENCOUNTER — Ambulatory Visit (INDEPENDENT_AMBULATORY_CARE_PROVIDER_SITE_OTHER): Payer: BLUE CROSS/BLUE SHIELD | Admitting: Psychiatry

## 2018-04-30 DIAGNOSIS — F3130 Bipolar disorder, current episode depressed, mild or moderate severity, unspecified: Secondary | ICD-10-CM | POA: Diagnosis not present

## 2018-04-30 DIAGNOSIS — R454 Irritability and anger: Secondary | ICD-10-CM | POA: Diagnosis not present

## 2018-04-30 DIAGNOSIS — F4310 Post-traumatic stress disorder, unspecified: Secondary | ICD-10-CM | POA: Diagnosis not present

## 2018-04-30 MED ORDER — DIVALPROEX SODIUM ER 500 MG PO TB24: ORAL_TABLET | ORAL | 1 refills | Status: DC

## 2018-04-30 MED ORDER — TOPIRAMATE 25 MG PO TABS: 25.0000 mg | ORAL_TABLET | Freq: Two times a day (BID) | ORAL | 1 refills | Status: DC

## 2018-04-30 MED ORDER — MIRTAZAPINE 15 MG PO TABS: ORAL_TABLET | ORAL | 1 refills | Status: DC

## 2018-04-30 MED ORDER — RISPERIDONE 3 MG PO TABS: 3.0000 mg | ORAL_TABLET | Freq: Every day | ORAL | 1 refills | Status: DC

## 2018-04-30 NOTE — Progress Notes (Addendum)
Hx psychosis. None now. 3 SA. None with meds.     [facility]  Patient ID: [name] [name],  MRN: [id number]  PCP: Patient, No Pcp Per  Date of Evaluation: 04/30/2018 Time spent:20 minutes  Chief Complaint:   HISTORY/CURRENT STATUS: HPI last seen 04/11/2018.  Having some anger and some manic symptoms.  Increase his Depakote ER 500 mg to 2 tablets 3 times a day.  Increase his Risperdal to 3 mg at bedtime.  Increased his Remeron to 15 mg at bed.  He was continue his Topamax. Currently he is doing about the same or possibly slightly worse.  Under a great deal of stress at work and with his girlfriend with legal problems.  He is having some depression worked hard to get up is not eating or sleeping as well as a light he has no suicidal thoughts no recent cutting.  States that he is not does not have angry because he is too tired to do that. He is having some anxiety.  He denies manic symptoms. His Depakote ER 500 mg 2 tablets 3 times a day.  Risperdal 3 mg a day at bedtime.  Remeron 15 mg a day at bedtime.  Topamax 50 mg a day. He does feel hopeless at times. 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  He works 5 to 7 days a week.  Individual Medical History/ Review of Systems: Changes? :gives hx of 3 suicidal attempts   Allergies: Bee venom  Current Medications:  Current Outpatient Medications:  .  benztropine (COGENTIN) 0.5 MG tablet, Take 0.5 mg by mouth 2 (two) times daily., Disp: , Rfl:  .  citalopram (CELEXA) 20 MG tablet, Take 1 tablet (20 mg total) by mouth daily. (Patient not taking: Reported on 04/11/2018), Disp: 30 tablet, Rfl: 0 .  divalproex (DEPAKOTE ER) 500 MG 24 hr tablet, 2 tabs tid, Disp: 180 tablet, Rfl: 1 .  mirtazapine (REMERON) 15 MG tablet, Take 0.5 tablets (7.5 mg total) by mouth at bedtime. 1/2 hs, Disp: 15 tablet, Rfl: 1 .  risperiDONE (RISPERDAL) 3 MG tablet, Take 1 tablet (3 mg total) by mouth at bedtime., Disp: 30 tablet, Rfl:  1 .  topiramate (TOPAMAX) 25 MG tablet, Take 1 tablet (25 mg total) by mouth 2 (two) times daily. 2/d, Disp: 60 tablet, Rfl: 1 Medication Side Effects: none  Family Medical/ Social History: Changes? No  MENTAL HEALTH EXAM:  There were no vitals taken for this visit.There is no height or weight on file to calculate BMI.  General Appearance: Casual exhausted.  Eye Contact:  Good  Speech:  Clear and Coherent  Volume:  Normal  Mood:  Depressed  Affect:  Appropriate  Thought Process:  Linear  Orientation:  Full (Time, Place, and Person)  Thought Content: Logical   Suicidal Thoughts:  No  Homicidal Thoughts:  No  Memory:  WNL  Judgement:  Good  Insight:  Good  Psychomotor Activity:  Normal  Concentration:  Concentration: Good  Recall:  Good  Fund of Knowledge: Good  Language: Good  Assets:  Desire for Improvement  ADL's:  Intact  Cognition: WNL  Prognosis:  Good    DIAGNOSES: No diagnosis found.  Receiving Psychotherapy: No  discussed starting counseling.   RECOMMENDATIONS: Overall the patient is not doing well.  We put him on the same dose of his medications the Depakote Risperdal and Remeron and Topamax we will add BuSpar 15 mg a day for a week  and then 15 twice daily.  He is to return in 3 weeks.  He commits to safety. Anne Fulay Jayse Hodkinson, PA-C

## 2018-05-21 ENCOUNTER — Ambulatory Visit: Payer: BLUE CROSS/BLUE SHIELD | Admitting: Psychiatry

## 2018-05-24 ENCOUNTER — Telehealth: Payer: Self-pay | Admitting: Psychiatry

## 2018-05-24 NOTE — Telephone Encounter (Signed)
Left message on patients phone that [name] [name] cannot do a phone session at this time.  He will need to come to the office for his scheduled appt. 06/07/18.  Also advised him that if he did a future phone session it is not covered by insurance and he would need to pay out of pocket for that visit.

## 2018-06-05 ENCOUNTER — Telehealth: Payer: Self-pay | Admitting: Psychiatry

## 2018-06-05 NOTE — Telephone Encounter (Signed)
Pt called.  Ask you to return his call @ [phone].

## 2018-06-05 NOTE — Telephone Encounter (Signed)
[name] [name] would like to talk to you about his insurance. He can be reached at [phone].

## 2018-06-07 ENCOUNTER — Ambulatory Visit: Payer: BLUE CROSS/BLUE SHIELD | Admitting: Psychiatry

## 2018-06-12 ENCOUNTER — Ambulatory Visit: Payer: BLUE CROSS/BLUE SHIELD | Admitting: Psychiatry

## 2018-07-26 ENCOUNTER — Ambulatory Visit: Payer: BLUE CROSS/BLUE SHIELD | Admitting: Psychiatry

## 2018-07-26 DIAGNOSIS — F3130 Bipolar disorder, current episode depressed, mild or moderate severity, unspecified: Secondary | ICD-10-CM | POA: Diagnosis not present

## 2018-07-26 DIAGNOSIS — R454 Irritability and anger: Secondary | ICD-10-CM | POA: Diagnosis not present

## 2018-07-26 MED ORDER — DIVALPROEX SODIUM ER 500 MG PO TB24: ORAL_TABLET | ORAL | 1 refills | Status: DC

## 2018-07-26 MED ORDER — RISPERIDONE 3 MG PO TABS: 3.0000 mg | ORAL_TABLET | Freq: Every day | ORAL | 1 refills | Status: DC

## 2018-07-26 MED ORDER — TRAZODONE HCL 50 MG PO TABS: ORAL_TABLET | ORAL | 1 refills | Status: DC

## 2018-07-26 MED ORDER — TOPIRAMATE 25 MG PO TABS: 25.0000 mg | ORAL_TABLET | Freq: Two times a day (BID) | ORAL | 1 refills | Status: DC

## 2018-07-26 NOTE — Progress Notes (Addendum)
[facility]  Patient ID: [name] [name],  MRN: [id number]  PCP: Patient, No Pcp Per  Date of Evaluation: 07/26/2018 Time spent:30 minutes  Chief Complaint:   HISTORY/CURRENT STATUS: HPI Pascual was last seen 04/30/2018.  With a diagnosis of bipolar disorder 1.  He was having problems at the time. He has had a deep depression for the past 4 months and is worse 5 days ago.  He quit work due to the stressors.  No suicidal thoughts but did have homicidal thoughts.  Had a lot of rage over the past 2 years.  Some insomnia.  History His complaints to me today are anger, depression, sleep.  We will and he will continue his same medications the Depakote 500 and 3 in the morning 3 in the evening he is to start trazodone 50 mg 1-2 at bedtime for sleep also wrote his prescription for Topamax 1 pill a day for appetite suppression.  He has no psychosis  Past Psychiatric Medication Trials: Depakote, Seroquel, risperidone, topiramate, buspirone, perphenazine, brief low-dose lithium Citalopram, Ritalin, Adderall, clonazepam, mirtazapine, fluoxetine, lorazepam,  Individual Medical History/ Review of Systems: Changes? :No   Allergies: Bee venom  Current Medications:  Current Outpatient Medications:  .  divalproex (DEPAKOTE ER) 500 MG 24 hr tablet, 2 tabs tid, Disp: 180 tablet, Rfl: 1 .  mirtazapine (REMERON) 15 MG tablet, 2 hs, Disp: 60 tablet, Rfl: 1 .  risperiDONE (RISPERDAL) 3 MG tablet, Take 1 tablet (3 mg total) by mouth at bedtime., Disp: 30 tablet, Rfl: 1 .  topiramate (TOPAMAX) 25 MG tablet, Take 1 tablet (25 mg total) by mouth 2 (two) times daily. 2/d, Disp: 60 tablet, Rfl: 1 Medication Side Effects: none  Family Medical/ Social History: Changes? No  MENTAL HEALTH EXAM:  There were no vitals taken for this visit.There is no height or weight on file to calculate BMI.  General Appearance: Casual  Eye Contact:  Fair  Speech:  Clear and Coherent  Volume:  Normal  Mood:  Angry   Affect:  Congruent  Thought Process:  Linear  Orientation:  Full (Time, Place, and Person)  Thought Content: ok  Suicidal Thoughts:  No  Homicidal Thoughts:  yes  Memory:  WNL  Judgement:  Fair  Insight:  Fair  Psychomotor Activity:  Normal  Concentration:  Concentration: Fair  Recall:  Good  Fund of Knowledge: Good  Language: Good  Assets:  Desire for Improvement  ADL's:  Intact  Cognition: WNL  Prognosis:  Fair    DIAGNOSES: No diagnosis found.  Receiving Psychotherapy: No    RECOMMENDATIONS: Keep Vong meds the same.  Adding trazodone 50 mg 1-2 at bedtime for sleep.  Also feeling is Topamax 1 pill a day for appetite suppressant.  He also is on Depakote 500 mg 3 in the morning and 3 in the evening   [employer], PA-C

## 2018-08-13 ENCOUNTER — Telehealth: Payer: Self-pay | Admitting: Physician Assistant

## 2018-08-13 DIAGNOSIS — Z79899 Other long term (current) drug therapy: Secondary | ICD-10-CM

## 2018-08-13 MED ORDER — RISPERIDONE 4 MG PO TABS: 4.0000 mg | ORAL_TABLET | Freq: Every day | ORAL | 1 refills | Status: DC
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
## 2018-08-22 ENCOUNTER — Encounter: Payer: BLUE CROSS/BLUE SHIELD | Admitting: Psychiatry

## 2018-08-22 ENCOUNTER — Other Ambulatory Visit: Payer: Self-pay

## 2018-08-22 NOTE — Progress Notes (Signed)
  This encounter was created in error - please disregard.  This encounter was created in error - please disregard.  This encounter was created in error - please disregard.  This encounter was created in error - please disregard. 

## 2018-08-23 ENCOUNTER — Ambulatory Visit: Payer: BLUE CROSS/BLUE SHIELD | Admitting: Psychiatry

## 2018-10-09 ENCOUNTER — Encounter: Payer: Self-pay | Admitting: Psychiatry

## 2018-10-09 ENCOUNTER — Ambulatory Visit (INDEPENDENT_AMBULATORY_CARE_PROVIDER_SITE_OTHER): Payer: BLUE CROSS/BLUE SHIELD | Admitting: Psychiatry

## 2018-10-09 ENCOUNTER — Other Ambulatory Visit: Payer: Self-pay

## 2018-10-09 DIAGNOSIS — F5105 Insomnia due to other mental disorder: Secondary | ICD-10-CM | POA: Diagnosis not present

## 2018-10-09 DIAGNOSIS — F4310 Post-traumatic stress disorder, unspecified: Secondary | ICD-10-CM | POA: Diagnosis not present

## 2018-10-09 DIAGNOSIS — R454 Irritability and anger: Secondary | ICD-10-CM

## 2018-10-09 DIAGNOSIS — F3130 Bipolar disorder, current episode depressed, mild or moderate severity, unspecified: Secondary | ICD-10-CM | POA: Diagnosis not present
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
## 2018-10-10 ENCOUNTER — Telehealth: Payer: Self-pay | Admitting: Psychiatry

## 2018-10-10 ENCOUNTER — Other Ambulatory Visit: Payer: Self-pay

## 2018-10-10 ENCOUNTER — Telehealth: Payer: Self-pay

## 2018-10-10 MED ORDER — LURASIDONE HCL 60 MG PO TABS: 60.0000 mg | ORAL_TABLET | Freq: Every day | ORAL | 2 refills | Status: DC

## 2018-10-10 NOTE — Telephone Encounter (Signed)
Prior authorization submitted for latuda 60 mg through [facility] approved effective 10/10/2018-10/08/2021

## 2018-10-10 NOTE — Telephone Encounter (Signed)
Submitted for office visit

## 2018-10-10 NOTE — Telephone Encounter (Signed)
Patient's mom called and said that [name] [name] was suppose to have a latuda 60 mg sent in by dr. Jennelle Human . They discussed that yesterday at their visit. Please send it into the [facility] on [address] in [place]

## 2018-10-11 ENCOUNTER — Other Ambulatory Visit: Payer: Self-pay

## 2018-10-11 MED ORDER — LURASIDONE HCL 60 MG PO TABS: 60.0000 mg | ORAL_TABLET | Freq: Every day | ORAL | 1 refills | Status: DC

## 2018-10-11 NOTE — Telephone Encounter (Signed)
[name] [name] is not a generic. Pt mom called back. Now able to get from manufacturer at a discounted rate for 1 year $44. Thanks for all your help mom stated.

## 2018-10-11 NOTE — Telephone Encounter (Signed)
Pt. Mom called back to advise Rx needs to be submitted as Latuda not generic. Approved but not in formulary so not covered and has not been approved by FDA yet. So insurance will not cover and will be $1800.

## 2018-10-11 NOTE — Telephone Encounter (Signed)
Thanks for update, hopefully nothing at pharmacy got changed.

## 2018-10-11 NOTE — Telephone Encounter (Signed)
Didn't know latuda was even a generic but will update rx.

## 2018-11-01 ENCOUNTER — Ambulatory Visit: Payer: BLUE CROSS/BLUE SHIELD | Admitting: Psychiatry

## 2018-11-19 ENCOUNTER — Telehealth: Payer: Self-pay | Admitting: Psychiatry

## 2018-11-19 NOTE — Telephone Encounter (Signed)
[name] [name] mother called in he was Clay's patient he is in a bad way came off of Depakote put on Latuda need appointment ASAP, in the last week patient has had a silent depression, I heard him in the background he was angry and cursing, feels different

## 2018-11-19 NOTE — Telephone Encounter (Signed)
Patient has apparently been noncompliant with meds.  He needs an appt to sort it out.  Put him on cancellation list for me, Helene Kelp and Janett Billow and Eulas Post.  If he can't wait until appt may have to go to ER

## 2018-11-21 ENCOUNTER — Ambulatory Visit: Payer: BLUE CROSS/BLUE SHIELD | Admitting: Psychiatry

## 2018-11-26 ENCOUNTER — Telehealth: Payer: Self-pay | Admitting: Psychiatry

## 2018-11-26 ENCOUNTER — Ambulatory Visit: Payer: BLUE CROSS/BLUE SHIELD | Admitting: Psychiatry

## 2018-11-26 NOTE — Telephone Encounter (Signed)
Mother had phoned Dr. Clovis Pu 11/19/2018 about Latuda, Depakote and other medications with patient angry in the background such that they were informed of the necessity to have an office appointment in order to determine which medication adjustments might be helpful when patient reportedly had stopped of his medications and seeking others.  Patient did not arrive for the 1320 appointment  Today set up as soon as possible with the first available provider in the office in Dr. Casimiro Needle out-of-town absence.

## 2018-11-28 ENCOUNTER — Ambulatory Visit (INDEPENDENT_AMBULATORY_CARE_PROVIDER_SITE_OTHER): Payer: BLUE CROSS/BLUE SHIELD | Admitting: Psychiatry

## 2018-11-28 ENCOUNTER — Telehealth: Payer: Self-pay | Admitting: Psychiatry

## 2018-11-28 ENCOUNTER — Encounter: Payer: Self-pay | Admitting: Psychiatry

## 2018-11-28 ENCOUNTER — Other Ambulatory Visit: Payer: Self-pay

## 2018-11-28 VITALS — BP 140/103 | HR 72 | Ht 69.5 in | Wt 195.0 lb

## 2018-11-28 DIAGNOSIS — F4312 Post-traumatic stress disorder, chronic: Secondary | ICD-10-CM

## 2018-11-28 DIAGNOSIS — F3131 Bipolar disorder, current episode depressed, mild: Secondary | ICD-10-CM

## 2018-11-28 DIAGNOSIS — F1210 Cannabis abuse, uncomplicated: Secondary | ICD-10-CM

## 2018-11-28 DIAGNOSIS — F902 Attention-deficit hyperactivity disorder, combined type: Secondary | ICD-10-CM | POA: Diagnosis not present

## 2018-11-28 MED ORDER — MIRTAZAPINE 15 MG PO TABS: 15.0000 mg | ORAL_TABLET | Freq: Every day | ORAL | 2 refills | Status: DC

## 2018-11-28 MED ORDER — TOPIRAMATE 25 MG PO TABS: 25.0000 mg | ORAL_TABLET | Freq: Two times a day (BID) | ORAL | 2 refills | Status: DC

## 2018-11-28 MED ORDER — TRAZODONE HCL 50 MG PO TABS: 50.0000 mg | ORAL_TABLET | Freq: Every day | ORAL | 2 refills | Status: DC

## 2018-11-28 MED ORDER — GABAPENTIN 300 MG PO CAPS: 300.0000 mg | ORAL_CAPSULE | Freq: Three times a day (TID) | ORAL | 2 refills | Status: DC

## 2018-11-28 MED ORDER — LATUDA 60 MG PO TABS: 60.0000 mg | ORAL_TABLET | Freq: Every day | ORAL | 2 refills | Status: DC

## 2018-11-28 NOTE — Telephone Encounter (Signed)
Pt had appt today, but gave the wrong pharmacy information. He would like his rx called in to [facility] on the [address] .
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
## 2018-11-28 NOTE — Telephone Encounter (Signed)
eScription's of 5 medications are canceled for at [facility] by phone and sent again instead to [facility] on [address] in [place] at the corner of [address]

## 2018-11-30 ENCOUNTER — Other Ambulatory Visit: Payer: Self-pay

## 2018-11-30 DIAGNOSIS — F3131 Bipolar disorder, current episode depressed, mild: Secondary | ICD-10-CM

## 2018-11-30 DIAGNOSIS — F4312 Post-traumatic stress disorder, chronic: Secondary | ICD-10-CM

## 2018-11-30 MED ORDER — MIRTAZAPINE 15 MG PO TABS: 15.0000 mg | ORAL_TABLET | Freq: Every day | ORAL | 2 refills | Status: DC

## 2018-12-11 ENCOUNTER — Ambulatory Visit: Payer: BLUE CROSS/BLUE SHIELD | Admitting: Psychiatry

## 2018-12-31 ENCOUNTER — Ambulatory Visit: Payer: BLUE CROSS/BLUE SHIELD | Admitting: Psychiatry

## 2019-01-04 ENCOUNTER — Encounter

## 2019-02-25 ENCOUNTER — Telehealth: Payer: Self-pay | Admitting: Psychiatry

## 2019-02-25 ENCOUNTER — Ambulatory Visit: Payer: BLUE CROSS/BLUE SHIELD | Admitting: Psychiatry

## 2019-02-25 NOTE — Telephone Encounter (Signed)
Mother, [name] [name] called.  She had just found out the [place] had car trouble getting here, and he doesn't have a phone so couldn't call.  She really wanted him to come because he had expressed to her that he could see himself dying.  If you can speak with her, you can call her for details.  I don't see that we have an ROI for her though.  Her # is the # no listed for his account.  She will try to get him to call to RS when he knows he can get here.

## 2019-02-25 NOTE — Telephone Encounter (Signed)
Patient does not attend his 1340 appointment today which however is canceled by reception as mother phones that he had car trouble on the way calling after the appointment completion as he is not allowed a phone and mother did not know of the car trouble therefore until later.  Mother may get him to reschedule by calling but reports she can be called to discuss problems such as stating he is seeing himself dying which is not an emergency and he has not provided consent but by precedent tends to act out toward the control of others requiring deciding to get help himself.  Mother is informed of office need of [name] [name]'s and sent for any phone call to mother about him.

## 2019-03-19 ENCOUNTER — Ambulatory Visit: Payer: BLUE CROSS/BLUE SHIELD | Admitting: Psychiatry

## 2019-03-27 ENCOUNTER — Ambulatory Visit: Payer: BLUE CROSS/BLUE SHIELD | Admitting: Psychiatry

## 2019-05-07 ENCOUNTER — Ambulatory Visit: Payer: BLUE CROSS/BLUE SHIELD | Admitting: Psychiatry

## 2019-05-14 ENCOUNTER — Ambulatory Visit: Payer: Self-pay | Admitting: Psychiatry

## 2019-06-12 ENCOUNTER — Other Ambulatory Visit: Payer: Self-pay

## 2019-06-12 ENCOUNTER — Ambulatory Visit (INDEPENDENT_AMBULATORY_CARE_PROVIDER_SITE_OTHER): Payer: BC Managed Care – PPO | Admitting: Psychiatry

## 2019-06-12 ENCOUNTER — Encounter: Payer: Self-pay | Admitting: Psychiatry

## 2019-06-12 VITALS — Ht 69.5 in | Wt 188.0 lb

## 2019-06-12 DIAGNOSIS — F3131 Bipolar disorder, current episode depressed, mild: Secondary | ICD-10-CM

## 2019-06-12 DIAGNOSIS — F902 Attention-deficit hyperactivity disorder, combined type: Secondary | ICD-10-CM | POA: Diagnosis not present

## 2019-06-12 DIAGNOSIS — F4312 Post-traumatic stress disorder, chronic: Secondary | ICD-10-CM | POA: Diagnosis not present

## 2019-06-12 MED ORDER — LURASIDONE HCL 80 MG PO TABS: 80.0000 mg | ORAL_TABLET | Freq: Every day | ORAL | 0 refills | Status: DC
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
## 2019-06-13 DIAGNOSIS — Z0289 Encounter for other administrative examinations: Secondary | ICD-10-CM

## 2019-07-03 ENCOUNTER — Encounter: Payer: Self-pay | Admitting: Psychiatry

## 2019-07-03 ENCOUNTER — Ambulatory Visit (INDEPENDENT_AMBULATORY_CARE_PROVIDER_SITE_OTHER): Payer: BC Managed Care – PPO | Admitting: Psychiatry

## 2019-07-03 DIAGNOSIS — F902 Attention-deficit hyperactivity disorder, combined type: Secondary | ICD-10-CM

## 2019-07-03 DIAGNOSIS — F3131 Bipolar disorder, current episode depressed, mild: Secondary | ICD-10-CM | POA: Diagnosis not present

## 2019-07-03 DIAGNOSIS — F4312 Post-traumatic stress disorder, chronic: Secondary | ICD-10-CM | POA: Diagnosis not present
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
## 2019-09-02 ENCOUNTER — Encounter: Payer: Self-pay | Admitting: Psychiatry

## 2019-09-02 ENCOUNTER — Ambulatory Visit (INDEPENDENT_AMBULATORY_CARE_PROVIDER_SITE_OTHER): Payer: BC Managed Care – PPO | Admitting: Psychiatry

## 2019-09-02 ENCOUNTER — Other Ambulatory Visit: Payer: Self-pay

## 2019-09-02 VITALS — Ht 69.5 in | Wt 185.0 lb

## 2019-09-02 DIAGNOSIS — F4312 Post-traumatic stress disorder, chronic: Secondary | ICD-10-CM | POA: Diagnosis not present

## 2019-09-02 DIAGNOSIS — F902 Attention-deficit hyperactivity disorder, combined type: Secondary | ICD-10-CM | POA: Diagnosis not present

## 2019-09-02 DIAGNOSIS — F3131 Bipolar disorder, current episode depressed, mild: Secondary | ICD-10-CM | POA: Diagnosis not present
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
## 2019-10-01 ENCOUNTER — Telehealth: Payer: Self-pay | Admitting: Psychiatry

## 2019-10-01 NOTE — Telephone Encounter (Signed)
Pt having issues @ work. Provider had given letter.Needs advise. Call back @  [phone]. Has appt 5/24. Need info ASAP. If he needs to move up appt for info please advise.

## 2019-10-02 NOTE — Telephone Encounter (Signed)
Mr. 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 Sinn possible by his request at [email or web address]

## 2019-10-14 ENCOUNTER — Telehealth: Payer: Self-pay | Admitting: Psychiatry

## 2019-10-14 ENCOUNTER — Ambulatory Visit: Payer: BC Managed Care – PPO | Admitting: Psychiatry

## 2019-10-14 NOTE — Telephone Encounter (Signed)
Pt is requesting a call back from Dr. Marlyne Beards. He was not able to attend his appointment today due to a funeral. He did reschedule and also would like Dr. Marlyne Beards to send him in another accomodation for work. Call back # [phone]

## 2019-10-14 NOTE — Telephone Encounter (Signed)
Mr. 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  He suggests he has had another mental breakdown as the reason for his repeated request but is not accepting escriptions for his Latuda.  I decline to send another generic letter for what he requested again as I feel that we will more harm than good as the company will not consider any validity in the repeated letters.  They suggest that he see their forensic psychologist may be more comfortable with their occupational medicine physician.  If he attends appointment here again for this purpose, I need him to bring his medication I can document what he is taking and be prepared to formulate a more medical psychiatric complete professional statement.  The patient hangs up without finishing the conversation, though he did state that he had to attend a funeral as the reason for not being here for his appointment today.

## 2019-10-22 ENCOUNTER — Ambulatory Visit: Payer: BC Managed Care – PPO | Admitting: Psychiatry

## 2019-11-07 ENCOUNTER — Emergency Department (INDEPENDENT_AMBULATORY_CARE_PROVIDER_SITE_OTHER)
Admission: EM | Admit: 2019-11-07 | Discharge: 2019-11-07 | Disposition: A | Discharge status: Home / Self Care | Payer: Self-pay | Source: Home / Self Care

## 2019-11-07 ENCOUNTER — Emergency Department (INDEPENDENT_AMBULATORY_CARE_PROVIDER_SITE_OTHER): Payer: BC Managed Care – PPO

## 2019-11-07 ENCOUNTER — Other Ambulatory Visit: Payer: Self-pay

## 2019-11-07 ENCOUNTER — Telehealth: Payer: Self-pay | Admitting: Emergency Medicine

## 2019-11-07 DIAGNOSIS — R109 Unspecified abdominal pain: Secondary | ICD-10-CM | POA: Diagnosis not present

## 2019-11-07 DIAGNOSIS — M545 Low back pain, unspecified: Secondary | ICD-10-CM

## 2019-11-07 LAB — POCT URINALYSIS DIP (MANUAL ENTRY)
Bilirubin, UA: NEGATIVE
Blood, UA: NEGATIVE
Glucose, UA: NEGATIVE mg/dL
Ketones, POC UA: NEGATIVE mg/dL
Leukocytes, UA: NEGATIVE
Nitrite, UA: NEGATIVE
Protein Ur, POC: NEGATIVE mg/dL
Spec Grav, UA: 1.02 (ref 1.010–1.025)
Urobilinogen, UA: 0.2 E.U./dL
pH, UA: 7 (ref 5.0–8.0)

## 2019-11-07 MED ORDER — KETOROLAC TROMETHAMINE 30 MG/ML IJ SOLN
30.0000 mg | Freq: Once | INTRAMUSCULAR | Status: AC
  Administered 2019-11-07: 30 mg via INTRAMUSCULAR

## 2019-11-07 MED ORDER — METHOCARBAMOL 500 MG PO TABS: 500.0000 mg | ORAL_TABLET | Freq: Two times a day (BID) | ORAL | 0 refills | Status: AC | PRN

## 2019-11-07 NOTE — ED Provider Notes (Addendum)
[facility]    CSN: [id number] Arrival date & time: 11/07/19  1151      History   Chief Complaint Chief Complaint  Patient presents with  . Abdominal Pain    HPI [name] [name] is a 29 y.o. male.   HPI  [name] [name] is a 29 y.o. male presenting to UC with c/o sudden onset Left lower abdominal pain and flank pain that started while he was at work about 1 hour PTA.  Pain is sharp and shooting. Pt works at [employer] so he does heavy lifting but he was on his lunch break when pain started. He did take a "pain pill" at his work's clinic that he believes was similar to ibuprofen and tylenol. No relief.  Denies fever, chills, vomiting or diarrhea but he does have mild nausea due to the severe pain.  Denies dysuria or hematuria. Denies scrotal pain or swelling. Denies radiation of pain or numbness down his legs. No hx of kidney stones. He does not know about family hx due to losing both his parents when he was young.    Past Medical History:  Diagnosis Date  . Bipolar 1 disorder (HCC)   . PTSD (post-traumatic stress disorder)     Patient Active Problem List   Diagnosis Date Noted  . Cannabis use disorder, mild, abuse 04/11/2018  . Attention deficit hyperactivity disorder (ADHD), combined type, moderate 04/10/2018  . Bipolar I disorder, mild, current or most recent episode depressed, with mixed features (HCC) 09/21/2014  . Chronic post-traumatic stress disorder 09/19/2014    History reviewed. No pertinent surgical history.     Home Medications    Prior to Admission medications   Medication Sig Start Date End Date Taking? Authorizing Provider  lurasidone (LATUDA) 80 MG TABS tablet Take 1 tablet (80 mg total) by mouth daily after supper. 06/12/19   Chauncey Mann, MD  methocarbamol (ROBAXIN) 500 MG tablet Take 1 tablet (500 mg total) by mouth 2 (two) times daily as needed for muscle spasms. 11/07/19   Lurene Shadow, PA-C  traZODone (DESYREL) 50 MG tablet Take 1 tablet (50  mg total) by mouth at bedtime as needed for sleep. 09/02/19   Chauncey Mann, MD    Family History Family History  Family history unknown: Yes    Social History Social History   Tobacco Use  . Smoking status: Current Every Day Smoker    Types: Cigarettes  . Smokeless tobacco: Never Used  Substance Use Topics  . Alcohol use: Yes    Alcohol/week: 9.0 standard drinks    Types: 9 Shots of liquor per week  . Drug use: No     Allergies   Bee venom   Review of Systems Review of Systems  Constitutional: Negative for chills and fever.  HENT: Negative for congestion, ear pain, sore throat, trouble swallowing and voice change.   Respiratory: Negative for cough and shortness of breath.   Cardiovascular: Negative for chest pain and palpitations.  Gastrointestinal: Positive for abdominal pain. Negative for diarrhea, nausea and vomiting.  Genitourinary: Positive for flank pain (Left). Negative for decreased urine volume, dysuria, genital sores, hematuria, penile pain, scrotal swelling, testicular pain and urgency.  Musculoskeletal: Positive for back pain. Negative for arthralgias and myalgias.  Skin: Negative for rash.  All other systems reviewed and are negative.    Physical Exam Triage Vital Signs ED Triage Vitals [11/07/19 1206]  Enc Vitals Group     BP 129/78     Pulse Rate  64     Resp 20     Temp 97.6 F (36.4 C)     Temp Source Oral     SpO2 98 %     Weight      Height      Head Circumference      Peak Flow      Pain Score 10     Pain Loc      Pain Edu?      Excl. in [place]?    No data found.  Updated Vital Signs BP 129/78 (BP Location: Left Arm)   Pulse 64   Temp 97.6 F (36.4 C) (Oral)   Resp 20   SpO2 98%   Visual Acuity Right Eye Distance:   Left Eye Distance:   Bilateral Distance:    Right Eye Near:   Left Eye Near:    Bilateral Near:     Physical Exam Vitals and nursing note reviewed.  Constitutional:      Appearance: He is well-developed.      Comments: Pt holding Left lower abdomen and Left flank, appears uncomfortable. Winces in pain at times. Pain appears to come in waves.  Alert and cooperative during exam.  HENT:     Head: Normocephalic and atraumatic.  Cardiovascular:     Rate and Rhythm: Normal rate and regular rhythm.  Pulmonary:     Effort: Pulmonary effort is normal. No respiratory distress.     Breath sounds: Normal breath sounds. No stridor. No wheezing, rhonchi or rales.  Abdominal:     General: There is no distension.     Palpations: Abdomen is soft.     Tenderness: There is abdominal tenderness in the left lower quadrant. There is no right CVA tenderness, left CVA tenderness, guarding or rebound. Negative signs include Murphy's sign and McBurney's sign.     Hernia: No hernia is present.  Musculoskeletal:        General: Normal range of motion.     Cervical back: Normal range of motion.  Skin:    General: Skin is warm and dry.  Neurological:     Mental Status: He is alert and oriented to person, place, and time.  Psychiatric:        Behavior: Behavior normal.      UC Treatments / Results  Labs (all labs ordered are listed, but only abnormal results are displayed) Labs Reviewed  POCT URINALYSIS DIP (MANUAL ENTRY)    EKG   Radiology CT Renal Stone Study  Result Date: 11/07/2019 CLINICAL DATA:  Left flank pain EXAM: CT ABDOMEN AND PELVIS WITHOUT CONTRAST TECHNIQUE: Multidetector CT imaging of the abdomen and pelvis was performed following the standard protocol without oral or IV contrast. COMPARISON:  None. FINDINGS: Lower chest: Lung bases are clear. Hepatobiliary: No focal liver lesions are appreciable. The gallbladder wall is not appreciably thickened. There is no biliary duct dilatation. Pancreas: There is no pancreatic mass or inflammatory focus. Spleen: Spleen measures 14.9 x 10.8 x 4.8 cm with a measured splenic volume of 386 cubic cm. No focal splenic lesions are appreciable. Adrenals/Urinary  Tract: Adrenals bilaterally appear normal. Kidneys bilaterally show no evident mass or hydronephrosis on either side. There is slight nephrocalcinosis bilaterally without well-defined calculus on either side. There is no evident ureteral calculus on either side. Urinary bladder is midline with wall thickness within normal limits. Stomach/Bowel: There is moderate stool in the colon. There is no appreciable bowel wall or mesenteric thickening. There is no evident bowel obstruction.  The terminal ileum appears normal. There is no demonstrable free air or portal venous air. Vascular/Lymphatic: There is no abdominal aortic aneurysm. No vascular lesions are evident on this noncontrast enhanced study. No adenopathy is appreciable in the abdomen or pelvis. Reproductive: Prostate and seminal vesicles are normal in size and contour. No evident pelvic mass. Other: Appendix appears normal. There is no evident abscess or ascites in the abdomen or pelvis. Musculoskeletal: No blastic or lytic bone lesions. No intramuscular or abdominal wall lesions are evident. IMPRESSION: 1. Slight nephrocalcinosis bilaterally without well-defined calculus. No ureterectasis on either side. No evident hydronephrosis. Urinary bladder wall thickness normal. 2. No bowel wall thickening or bowel obstruction. No abscess in the abdomen or pelvis. Appendix appears normal. 3. Prominent spleen without focal splenic lesion evident on noncontrast enhanced study. Electronically Signed   By: Bretta Bang III M.D.   On: 11/07/2019 12:47    Procedures Procedures (including critical care time)  Medications Ordered in UC Medications  ketorolac (TORADOL) 30 MG/ML injection 30 mg (30 mg Intramuscular Given 11/07/19 1213)    Initial Impression / Assessment and Plan / UC Course  I have reviewed the triage vital signs and the nursing notes.  Pertinent labs & imaging results that were available during my care of the patient were reviewed by me and  considered in my medical decision making (see chart for details).    Due to severity of pain, offered to send pt to hospital for evaluation and treatment with narcotic pain medication, pt declined. Pt okay with Toradol and CT here.  Discussed imaging with pt Pain has improved from 10/10 to 7/10 after given Toradol 30mg  IM UA: WNL Mild tenderness on Left lower abdomen Abdomen is soft, no tenderness to Right side of abdomen Suspect muscle spasms from physically intense job Will have pt try trial of Robaxin Discussed symptoms that warrant emergent care in the ED. AVS provided   Final Clinical Impressions(s) / UC Diagnoses   Final diagnoses:  Left flank pain  Acute left-sided low back pain without sciatica     Discharge Instructions      You may take 500mg  acetaminophen every 4-6 hours or in combination with ibuprofen 400-600mg  every 6-8 hours as needed for pain and inflammation.  Call to schedule a follow up appointment with Sports Medicine or family medicine if not improving by early next week.  Call 911 or go to the hospital if symptoms worsening- worsening pain, vomiting, fever, pain/numbness/or weakness in legs, or other new concerning symptoms develop.      ED Prescriptions    Medication Sig Dispense Auth. Provider   methocarbamol (ROBAXIN) 500 MG tablet Take 1 tablet (500 mg total) by mouth 2 (two) times daily as needed for muscle spasms. 10 tablet , PA-C     I have reviewed the PDMP during this encounter.     , Lurene Shadow 11/07/19 1821

## 2019-11-07 NOTE — ED Triage Notes (Signed)
Patient presents to Urgent Care with complaints of severe ans sudden left mid-abdominal pain since about an hour ago. Patient reports he had just finished his lunch break at work when the pain began. Pt has been trying to drink more water, exercise more. Pt denies urinary sx.

## 2019-11-07 NOTE — Telephone Encounter (Signed)
pt states he has not been able to pick up the prescribed Robaxin due to lack of cash.he took more Tylenol with only mild relief. pain now is under Left ribs when he breaths. Advised pt go to ED for bigger workup. pt understanding and agreeable w/ plan

## 2019-11-07 NOTE — Discharge Instructions (Signed)
°  You may take 500mg  acetaminophen every 4-6 hours or in combination with ibuprofen 400-600mg  every 6-8 hours as needed for pain and inflammation.  Call to schedule a follow up appointment with Sports Medicine or family medicine if not improving by early next week.  Call 911 or go to the hospital if symptoms worsening- worsening pain, vomiting, fever, pain/numbness/or weakness in legs, or other new concerning symptoms develop.

## 2019-11-15 ENCOUNTER — Telehealth: Payer: Self-pay

## 2019-11-15 NOTE — Telephone Encounter (Signed)
Pt needs doc note re printed due to having lost it. Pt will p/u tomorrow.

## 2020-03-11 ENCOUNTER — Encounter: Payer: Self-pay | Admitting: Psychiatry

## 2020-07-16 ENCOUNTER — Other Ambulatory Visit: Payer: Self-pay

## 2020-07-16 ENCOUNTER — Ambulatory Visit (INDEPENDENT_AMBULATORY_CARE_PROVIDER_SITE_OTHER): Payer: BLUE CROSS/BLUE SHIELD | Admitting: Adult Health

## 2020-07-16 DIAGNOSIS — F5105 Insomnia due to other mental disorder: Secondary | ICD-10-CM | POA: Diagnosis not present

## 2020-07-16 DIAGNOSIS — F4312 Post-traumatic stress disorder, chronic: Secondary | ICD-10-CM | POA: Diagnosis not present

## 2020-07-16 DIAGNOSIS — F902 Attention-deficit hyperactivity disorder, combined type: Secondary | ICD-10-CM | POA: Diagnosis not present

## 2020-07-16 DIAGNOSIS — F411 Generalized anxiety disorder: Secondary | ICD-10-CM

## 2020-07-16 DIAGNOSIS — F3131 Bipolar disorder, current episode depressed, mild: Secondary | ICD-10-CM | POA: Diagnosis not present

## 2020-07-16 DIAGNOSIS — F331 Major depressive disorder, recurrent, moderate: Secondary | ICD-10-CM

## 2020-07-16 NOTE — Progress Notes (Signed)
[name] [name] [id number] [birth year] 30 y.o.  Subjective:   Patient ID:  [name] [name] is a 30 y.o. (DOB December 03, 1990) male.  Chief Complaint: No chief complaint on file.   HPI   [name] [name] presents to the office today for follow-up of PTSD, ADHD, GAD, MDD, BPD 1, and insomia.   Describes mood today as - Mood symptoms - reports depression, anxiety - "always", and irritability - "a lot of the time". Stating "I'm going through a lot right now". Recently ended a 4 year "narcissistic" relationship with girlfriend on January 31st. Stating "I've been trough a lot in that relationship". Report no enjoyment in usual activities. Not taking medications currently - would like to try something to help stabilize mood. Stating "I have been on several medications over the years and I don't know what has worked and what doesn't". Decreased interest and motivation. Taking medications as prescribed.  Energy levels stable. Active, has a regular exercise routine - 4 days a week.  Enjoys some usual interests and activities. Single - recent break-up. Recently moved in with mother. Spending time with family. Appetite adequate. Weight stable. Sleeps better some nights than others. Sleeping better now that he is at home. Averages 5 to 6 hours. Stating "I have difficulties going to sleep and difficulties getting up".  Focus and concentration stable. Completing tasks. Managing aspects of household. Working as a Dealer. Denies SI or HI.  Denies AH or VH.  Previous medication trials: Karmen Stabs   Review of Systems:  Review of Systems  Musculoskeletal: Negative for gait problem.  Neurological: Negative for tremors.  Psychiatric/Behavioral:       Please refer to HPI    Medications: I have reviewed the patient's current medications.  Current Outpatient Medications  Medication Sig Dispense Refill  . risperiDONE (RISPERDAL) 1 MG tablet Take 1/2 tablet at bedtime for 7 days, then one tablet at  bedtime. 30 tablet 2  . lurasidone (LATUDA) 80 MG TABS tablet Take 1 tablet (80 mg total) by mouth daily after supper. 35 tablet 0  . methocarbamol (ROBAXIN) 500 MG tablet Take 1 tablet (500 mg total) by mouth 2 (two) times daily as needed for muscle spasms. 10 tablet 0  . traZODone (DESYREL) 50 MG tablet Take 1 tablet (50 mg total) by mouth at bedtime as needed for sleep. 30 tablet 0   No current facility-administered medications for this visit.    Medication Side Effects: None  Allergies:  Allergies  Allergen Reactions  . Bee Venom Hives    Past Medical History:  Diagnosis Date  . Bipolar 1 disorder (HCC)   . PTSD (post-traumatic stress disorder)     Family History  Family history unknown: Yes    Social History   Socioeconomic History  . Marital status: Single    Spouse name: Not on file  . Number of children: Not on file  . Years of education: Not on file  . Highest education level: Not on file  Occupational History  . Not on file  Tobacco Use  . Smoking status: Current Every Day Smoker    Types: Cigarettes  . Smokeless tobacco: Never Used  Substance and Sexual Activity  . Alcohol use: Yes    Alcohol/week: 9.0 standard drinks    Types: 9 Shots of liquor per week  . Drug use: No  . Sexual activity: Yes  Other Topics Concern  . Not on file  Social History Narrative  . Not on file   Social Determinants of  Health   Financial Resource Strain: Not on file  Food Insecurity: Not on file  Transportation Needs: Not on file  Physical Activity: Not on file  Stress: Not on file  Social Connections: Not on file  Intimate Partner Violence: Not on file    Past Medical History, Surgical history, Social history, and Family history were reviewed and updated as appropriate.   Please see review of systems for further details on the patient's review from today.   Objective:   Physical Exam:  There were no vitals taken for this visit.  Physical Exam Constitutional:       General: He is not in acute distress. Musculoskeletal:        General: No deformity.  Neurological:     Mental Status: He is alert and oriented to person, place, and time.     Coordination: Coordination normal.  Psychiatric:        Attention and Perception: Attention and perception normal. He does not perceive auditory or visual hallucinations.        Mood and Affect: Mood normal. Mood is not anxious or depressed. Affect is not labile, blunt, angry or inappropriate.        Speech: Speech normal.        Behavior: Behavior normal.        Thought Content: Thought content normal. Thought content is not paranoid or delusional. Thought content does not include homicidal or suicidal ideation. Thought content does not include homicidal or suicidal plan.        Cognition and Memory: Cognition and memory normal.        Judgment: Judgment normal.     Comments: Insight intact     Lab Review:     Component Value Date/Time   NA 137 09/18/2014 1420   K 3.6 09/18/2014 1420   CL 103 09/18/2014 1420   CO2 22 09/18/2014 1420   GLUCOSE 100 (H) 09/18/2014 1420   BUN 9 09/18/2014 1420   CREATININE 0.86 09/18/2014 1420   CALCIUM 10.0 09/18/2014 1420   PROT 7.9 09/18/2014 1420   ALBUMIN 4.4 09/18/2014 1420   AST 31 09/18/2014 1420   ALT 36 09/18/2014 1420   ALKPHOS 50 09/18/2014 1420   BILITOT 0.5 09/18/2014 1420   GFRNONAA >90 09/18/2014 1420   GFRAA >90 09/18/2014 1420       Component Value Date/Time   WBC 6.2 09/18/2014 1420   RBC 4.85 09/18/2014 1420   HGB 15.6 09/18/2014 1420   HCT 42.4 09/18/2014 1420   PLT 218 09/18/2014 1420   MCV 87.4 09/18/2014 1420   MCH 32.2 09/18/2014 1420   MCHC 36.8 (H) 09/18/2014 1420   RDW 12.5 09/18/2014 1420    No results found for: POCLITH, LITHIUM   Lab Results  Component Value Date   VALPROATE 58.5 07/01/2009     .res Assessment: Plan:    Plan:  PDMP reviewed  1. Add Risperdal 1mg  -  0.5mg  at hs x 7 days, then increase to 1mg   daily for sleep and mood stabilization.   Read and reviewed note with patient for accuracy.   RTC 4 weeks  Patient advised to contact office with any questions, adverse effects, or acute worsening in signs and symptoms.  Discussed potential metabolic side effects associated with atypical antipsychotics, as well as potential risk for movement side effects. Advised pt to contact office if movement side effects occur.     Diagnoses and all orders for this visit:  Chronic post-traumatic stress disorder  Bipolar I disorder, mild, current or most recent episode depressed, with mixed features (HCC)  Attention deficit hyperactivity disorder (ADHD), combined type, moderate  Insomnia due to mental condition  Generalized anxiety disorder  Major depressive disorder, recurrent episode, moderate (HCC)  Other orders -     risperiDONE (RISPERDAL) 1 MG tablet; Take 1/2 tablet at bedtime for 7 days, then one tablet at bedtime.     Please see After Visit Summary for patient specific instructions.  Future Appointments  Date Time Provider Department Center  08/17/2020  9:40 AM , Thereasa Solo, NP [facility] None    No orders of the defined types were placed in this encounter.   -------------------------------

## 2020-07-17 ENCOUNTER — Encounter: Payer: Self-pay | Admitting: Adult Health

## 2020-07-17 MED ORDER — RISPERIDONE 1 MG PO TABS: ORAL_TABLET | ORAL | 2 refills | Status: DC

## 2020-07-20 ENCOUNTER — Telehealth: Payer: Self-pay | Admitting: Adult Health

## 2020-07-20 NOTE — Telephone Encounter (Signed)
Can we call to discuss. I think we had to leave a VM last week.

## 2020-07-20 NOTE — Telephone Encounter (Signed)
Latrail called because he was waiting on a call to discuss what medications you were going to prescribe for him.  I did tell him that a prescription for Risperidone was sent to [facility] on [address]. He has changed pharmacies.  The pharmacies on record need to be removed.  Please send the Risperidone prescription to [facility] on [address] [facility] [address]. In [place] and make this his preferred pharmacy.  Also, he wanted to know what medication you were going to prescribe in place of the [facility].  Please call to discuss/

## 2020-07-20 NOTE — Telephone Encounter (Signed)
I tried patient's number again and I get a message that says unable to complete call at this time and to call back. Nowhere to leave message like last week.

## 2020-07-21 NOTE — Telephone Encounter (Signed)
Noted  

## 2020-07-21 NOTE — Telephone Encounter (Signed)
I tried calling patient again with no answer

## 2020-07-23 ENCOUNTER — Other Ambulatory Visit: Payer: Self-pay

## 2020-07-23 MED ORDER — RISPERIDONE 1 MG PO TABS: ORAL_TABLET | ORAL | 2 refills | Status: DC

## 2020-07-23 NOTE — Telephone Encounter (Signed)
Send his Rx for Risperdal 1 mg to [facility] in [place]   I will close out his message since we are unable to reach him

## 2020-08-17 ENCOUNTER — Ambulatory Visit: Payer: BLUE CROSS/BLUE SHIELD | Admitting: Adult Health

## 2020-09-01 ENCOUNTER — Ambulatory Visit: Payer: BLUE CROSS/BLUE SHIELD | Admitting: Adult Health

## 2020-09-29 ENCOUNTER — Other Ambulatory Visit: Payer: Self-pay

## 2020-09-29 ENCOUNTER — Encounter: Payer: Self-pay | Admitting: Adult Health

## 2020-09-29 ENCOUNTER — Ambulatory Visit (INDEPENDENT_AMBULATORY_CARE_PROVIDER_SITE_OTHER): Payer: 59 | Admitting: Adult Health

## 2020-09-29 DIAGNOSIS — F902 Attention-deficit hyperactivity disorder, combined type: Secondary | ICD-10-CM | POA: Diagnosis not present

## 2020-09-29 DIAGNOSIS — F411 Generalized anxiety disorder: Secondary | ICD-10-CM

## 2020-09-29 DIAGNOSIS — F4312 Post-traumatic stress disorder, chronic: Secondary | ICD-10-CM

## 2020-09-29 DIAGNOSIS — F3131 Bipolar disorder, current episode depressed, mild: Secondary | ICD-10-CM | POA: Diagnosis not present

## 2020-09-29 DIAGNOSIS — F5105 Insomnia due to other mental disorder: Secondary | ICD-10-CM | POA: Diagnosis not present

## 2020-09-29 MED ORDER — MIRTAZAPINE 15 MG PO TABS: 15.0000 mg | ORAL_TABLET | Freq: Every day | ORAL | 2 refills | Status: DC

## 2020-09-29 MED ORDER — RISPERIDONE 2 MG PO TABS: ORAL_TABLET | ORAL | 2 refills | Status: DC

## 2020-09-29 NOTE — Progress Notes (Signed)
[name] [name] [id number] Nov 08, 1990 30 y.o.  Subjective:   Patient ID:  [name] [name] is a 30 y.o. (DOB 16-Oct-1990) male.  Chief Complaint: No chief complaint on file.   HPI [name] [name] presents to the office today for follow-up of PTSD, ADHD, GAD, MDD, BPD 1, and insomia.   Describes mood today as - Mood symptoms - reports depression, anxiety, and irritability. Stating "I'm going through a lot right now". Feels angry. Has been taking Risperdal 1mg  at bedtime. Reporting a recent event in work setting. Started crying when manager tried to talk to him. EMS was called and a crisis unit was sent to help him. He was not hospitalized. Does feel like he needs a few weeks off to try and get himself together. Does not want to lose his job and is requesting a leave of absence. Currently staying with mother. Feels like addition of Risperdal has been helpful, but does not feel like it is working well enough. Decreased interest and motivation. Taking medications as prescribed.  Energy levels very low - "just muttling through". Active, does not have a regular exercise routine. Enjoys some usual interests and activities. Single. Recently moved in with mother. Spending time with family. Appetite adequate. Weight stable. Sleeps better some nights than others. Averages 5 hours - mind racing.  Focus and concentration stable. Completing tasks. Managing aspects of household. Working at as an [employer]. Denies SI or HI.  Denies AH or VH.  Previous medication trials: Chief of Staff  Review of Systems:  Review of Systems  Musculoskeletal: Negative for gait problem.  Neurological: Negative for tremors.  Psychiatric/Behavioral:       Please refer to HPI    Medications: I have reviewed the patient's current medications.  Current Outpatient Medications  Medication Sig Dispense Refill  . mirtazapine (REMERON) 15 MG tablet Take 1 tablet (15 mg total) by mouth at bedtime. 30 tablet 2  . methocarbamol  (ROBAXIN) 500 MG tablet Take 1 tablet (500 mg total) by mouth 2 (two) times daily as needed for muscle spasms. 10 tablet 0  . risperiDONE (RISPERDAL) 2 MG tablet Take one tablet at bedtime. 60 tablet 2  . traZODone (DESYREL) 50 MG tablet Take 1 tablet (50 mg total) by mouth at bedtime as needed for sleep. 30 tablet 0   No current facility-administered medications for this visit.    Medication Side Effects: None  Allergies:  Allergies  Allergen Reactions  . Bee Venom Hives    Past Medical History:  Diagnosis Date  . Bipolar 1 disorder (HCC)   . PTSD (post-traumatic stress disorder)     Past Medical History, Surgical history, Social history, and Family history were reviewed and updated as appropriate.   Please see review of systems for further details on the patient's review from today.   Objective:   Physical Exam:  There were no vitals taken for this visit.  Physical Exam Constitutional:      General: He is not in acute distress. Musculoskeletal:        General: No deformity.  Neurological:     Mental Status: He is alert and oriented to person, place, and time.     Coordination: Coordination normal.  Psychiatric:        Attention and Perception: Attention and perception normal. He does not perceive auditory or visual hallucinations.        Mood and Affect: Mood is anxious and depressed. Affect is not labile, blunt, angry or inappropriate.  Speech: Speech normal.        Behavior: Behavior normal.        Thought Content: Thought content normal. Thought content is not paranoid or delusional. Thought content does not include homicidal or suicidal ideation. Thought content does not include homicidal or suicidal plan.        Cognition and Memory: Cognition and memory normal.        Judgment: Judgment normal.     Comments: Insight intact     Lab Review:     Component Value Date/Time   NA 137 09/18/2014 1420   K 3.6 09/18/2014 1420   CL 103 09/18/2014 1420   CO2  22 09/18/2014 1420   GLUCOSE 100 (H) 09/18/2014 1420   BUN 9 09/18/2014 1420   CREATININE 0.86 09/18/2014 1420   CALCIUM 10.0 09/18/2014 1420   PROT 7.9 09/18/2014 1420   ALBUMIN 4.4 09/18/2014 1420   AST 31 09/18/2014 1420   ALT 36 09/18/2014 1420   ALKPHOS 50 09/18/2014 1420   BILITOT 0.5 09/18/2014 1420   GFRNONAA >90 09/18/2014 1420   GFRAA >90 09/18/2014 1420       Component Value Date/Time   WBC 6.2 09/18/2014 1420   RBC 4.85 09/18/2014 1420   HGB 15.6 09/18/2014 1420   HCT 42.4 09/18/2014 1420   PLT 218 09/18/2014 1420   MCV 87.4 09/18/2014 1420   MCH 32.2 09/18/2014 1420   MCHC 36.8 (H) 09/18/2014 1420   RDW 12.5 09/18/2014 1420    No results found for: POCLITH, LITHIUM   Lab Results  Component Value Date   VALPROATE 58.5 07/01/2009     .res Assessment: Plan:    Plan:  PDMP reviewed  1. Increase Risperdal 1mg  to 2mg  daily. 2. Add Remeron 15mg  at hs   Out of work 09/28/2020 through 0529/2022.   RTC 4 weeks  Patient advised to contact office with any questions, adverse effects, or acute worsening in signs and symptoms.  Discussed potential metabolic side effects associated with atypical antipsychotics, as well as potential risk for movement side effects. Advised pt to contact office if movement side effects occur.    Diagnoses and all orders for this visit:  Attention deficit hyperactivity disorder (ADHD), combined type, moderate  Chronic post-traumatic stress disorder -     risperiDONE (RISPERDAL) 2 MG tablet; Take one tablet at bedtime. -     mirtazapine (REMERON) 15 MG tablet; Take 1 tablet (15 mg total) by mouth at bedtime.  Bipolar I disorder, mild, current or most recent episode depressed, with mixed features (HCC) -     risperiDONE (RISPERDAL) 2 MG tablet; Take one tablet at bedtime. -     mirtazapine (REMERON) 15 MG tablet; Take 1 tablet (15 mg total) by mouth at bedtime.  Insomnia due to mental condition  Generalized anxiety  disorder     Please see After Visit Summary for patient specific instructions.  Future Appointments  Date Time Provider Department Center  10/09/2020  2:00 PM Ioannis Schuh, 11/28/2020, NP [facility] None    No orders of the defined types were placed in this encounter.   -------------------------------

## 2020-10-02 ENCOUNTER — Telehealth: Payer: Self-pay | Admitting: Adult Health

## 2020-10-02 NOTE — Telephone Encounter (Signed)
Fax number is [phone]

## 2020-10-02 NOTE — Telephone Encounter (Signed)
He needs the letter to state he came in to get evaluated due to his mental health and you deemed it necessary for him to be out of work until 10/18/20.He is requesting we email it to them but I told him to call me back with a fax number.

## 2020-10-02 NOTE — Telephone Encounter (Signed)
Please review

## 2020-10-02 NOTE — Telephone Encounter (Signed)
Letter completed and signed. In front office box awaiting fax number from pt.

## 2020-10-02 NOTE — Telephone Encounter (Signed)
[name] [name] called because you have him out on medical leave.  He employer is requesting a letter stating the reason for the leave and more detail information.  He requests that you call him to discuss exactly what they want so he get can this letter.  Please call him ASAP because it is needed for approval of his medical leave.  Call [phone].

## 2020-10-02 NOTE — Telephone Encounter (Signed)
Ok to write letter - I will sign when completed.

## 2020-10-02 NOTE — Telephone Encounter (Signed)
Beth, please review

## 2020-10-02 NOTE — Telephone Encounter (Signed)
Ok to call and get the information needed.

## 2020-10-04 NOTE — Telephone Encounter (Signed)
Noted thank you

## 2020-10-05 NOTE — Telephone Encounter (Signed)
Letter faxed to number given to Mona - [phone]

## 2020-10-09 ENCOUNTER — Telehealth (INDEPENDENT_AMBULATORY_CARE_PROVIDER_SITE_OTHER): Payer: 59 | Admitting: Adult Health

## 2020-10-09 ENCOUNTER — Encounter: Payer: Self-pay | Admitting: Adult Health

## 2020-10-09 DIAGNOSIS — F411 Generalized anxiety disorder: Secondary | ICD-10-CM

## 2020-10-09 DIAGNOSIS — F4312 Post-traumatic stress disorder, chronic: Secondary | ICD-10-CM

## 2020-10-09 DIAGNOSIS — F3131 Bipolar disorder, current episode depressed, mild: Secondary | ICD-10-CM | POA: Diagnosis not present

## 2020-10-09 DIAGNOSIS — F902 Attention-deficit hyperactivity disorder, combined type: Secondary | ICD-10-CM

## 2020-10-09 DIAGNOSIS — F5105 Insomnia due to other mental disorder: Secondary | ICD-10-CM

## 2020-10-09 NOTE — Progress Notes (Signed)
[name] [name] [id number] 1990-05-26 30 y.o.  Virtual Visit via Telephone Note  I connected with pt on 10/09/20 at  2:00 PM EDT by telephone and verified that I am speaking with the correct person using two identifiers.   I discussed the limitations, risks, security and privacy concerns of performing an evaluation and management service by telephone and the availability of in person appointments. I also discussed with the patient that there may be a patient responsible charge related to this service. The patient expressed understanding and agreed to proceed.   I discussed the assessment and treatment plan with the patient. The patient was provided an opportunity to ask questions and all were answered. The patient agreed with the plan and demonstrated an understanding of the instructions.   The patient was advised to call back or seek an in-person evaluation if the symptoms worsen or if the condition fails to improve as anticipated.  I provided 30 minutes of non-face-to-face time during this encounter.  The patient was located at home.  The provider was located at [facility].   Dorothyann Gibbs, NP   Subjective:   Patient ID:  [name] [name] is a 30 y.o. (DOB 1990-06-24) male.  Chief Complaint: No chief complaint on file.   HPI Petr Bontempo presents for follow-up of PTSD, ADHD, GAD, MDD, BPD 1, and insomia.   Describes mood today as "not too good". Pleasant. Mood symptoms - reports decreased depression, anxiety, and irritability. Stating "the medications are working". Feels like it is a "combination" of medications and therapy that are helpful. Feels "frustrated" currently. Stating "I'm not doing that great, stressed out". Found out that he will not be receiving compensation while on leave from employer. Feels like they offered him time off to get in a better mental state, but are not willing to support that decision with compensation while on leave. Stating "I can't focus on my  mental health with being stressed out financially". Uncertain how he will maintain finanacially while out of work. Stating "I'm trying to figure out what I am going to do". Feels like Risperdal and Remeron are working for him. Varying interest and motivation. Taking medications as prescribed.  Energy levels vary. Active, does not have a regular exercise routine. Enjoys some usual interests and activities. Single. Mother local. Spending time with family. Appetite adequate. Weight stable. Sleep has improved.  Focus and concentration stable. Completing tasks. Managing aspects of household. 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 Denies SI or HI.  Denies AH or VH. Therapist - [facility]   Previous medication trials: Karmen Stabs    Review of Systems:  Review of Systems  Musculoskeletal: Negative for gait problem.  Neurological: Negative for tremors.  Psychiatric/Behavioral:       Please refer to HPI    Medications: I have reviewed the patient's current medications.  Current Outpatient Medications  Medication Sig Dispense Refill  . methocarbamol (ROBAXIN) 500 MG tablet Take 1 tablet (500 mg total) by mouth 2 (two) times daily as needed for muscle spasms. 10 tablet 0  . mirtazapine (REMERON) 15 MG tablet Take 1 tablet (15 mg total) by mouth at bedtime. 30 tablet 2  . risperiDONE (RISPERDAL) 2 MG tablet Take one tablet at bedtime. 60 tablet 2   No current facility-administered medications for this visit.    Medication Side Effects: None  Allergies:  Allergies  Allergen Reactions  . Bee Venom Hives    Past Medical History:  Diagnosis Date  .  Bipolar 1 disorder (HCC)   . PTSD (post-traumatic stress disorder)     Family History  Family history unknown: Yes    Social History   Socioeconomic History  . Marital status: Single    Spouse name: Not on file  . Number of children: Not on file  . Years of education: Not on file  . Highest  education level: Not on file  Occupational History  . Not on file  Tobacco Use  . Smoking status: Current Every Day Smoker    Types: Cigarettes  . Smokeless tobacco: Never Used  Substance and Sexual Activity  . Alcohol use: Yes    Alcohol/week: 9.0 standard drinks    Types: 9 Shots of liquor per week  . Drug use: No  . Sexual activity: Yes  Other Topics Concern  . Not on file  Social History Narrative  . Not on file   Social Determinants of Health   Financial Resource Strain: Not on file  Food Insecurity: Not on file  Transportation Needs: Not on file  Physical Activity: Not on file  Stress: Not on file  Social Connections: Not on file  Intimate Partner Violence: Not on file    Past Medical History, Surgical history, Social history, and Family history were reviewed and updated as appropriate.   Please see review of systems for further details on the patient's review from today.   Objective:   Physical Exam:  There were no vitals taken for this visit.  Physical Exam Neurological:     Mental Status: He is alert and oriented to person, place, and time.     Cranial Nerves: No dysarthria.  Psychiatric:        Attention and Perception: Attention and perception normal.        Mood and Affect: Mood normal.        Speech: Speech normal.        Behavior: Behavior is cooperative.        Thought Content: Thought content normal. Thought content is not paranoid or delusional. Thought content does not include homicidal or suicidal ideation. Thought content does not include homicidal or suicidal plan.        Cognition and Memory: Cognition and memory normal.        Judgment: Judgment normal.     Comments: Insight intact     Lab Review:     Component Value Date/Time   NA 137 09/18/2014 1420   K 3.6 09/18/2014 1420   CL 103 09/18/2014 1420   CO2 22 09/18/2014 1420   GLUCOSE 100 (H) 09/18/2014 1420   BUN 9 09/18/2014 1420   CREATININE 0.86 09/18/2014 1420   CALCIUM 10.0  09/18/2014 1420   PROT 7.9 09/18/2014 1420   ALBUMIN 4.4 09/18/2014 1420   AST 31 09/18/2014 1420   ALT 36 09/18/2014 1420   ALKPHOS 50 09/18/2014 1420   BILITOT 0.5 09/18/2014 1420   GFRNONAA >90 09/18/2014 1420   GFRAA >90 09/18/2014 1420       Component Value Date/Time   WBC 6.2 09/18/2014 1420   RBC 4.85 09/18/2014 1420   HGB 15.6 09/18/2014 1420   HCT 42.4 09/18/2014 1420   PLT 218 09/18/2014 1420   MCV 87.4 09/18/2014 1420   MCH 32.2 09/18/2014 1420   MCHC 36.8 (H) 09/18/2014 1420   RDW 12.5 09/18/2014 1420    No results found for: POCLITH, LITHIUM   Lab Results  Component Value Date   VALPROATE 58.5 07/01/2009     .  res Assessment: Plan:    Plan:  PDMP reviewed  1. Increase Risperdal 1mg  to 2mg  daily. 2. Add Remeron 15mg  at hs   Out of work 09/28/2020 through 0529/2022.   RTC 4 weeks  Patient advised to contact office with any questions, adverse effects, or acute worsening in signs and symptoms.  Discussed potential metabolic side effects associated with atypical antipsychotics, as well as potential risk for movement side effects. Advised pt to contact office if movement side effects occur.     Diagnoses and all orders for this visit:  Chronic post-traumatic stress disorder  Bipolar I disorder, mild, current or most recent episode depressed, with mixed features (HCC)  Attention deficit hyperactivity disorder (ADHD), combined type, moderate  Insomnia due to mental condition  Generalized anxiety disorder    Please see After Visit Summary for patient specific instructions.  No future appointments.  No orders of the defined types were placed in this encounter.     -------------------------------

## 2020-12-21 ENCOUNTER — Ambulatory Visit: Payer: 59 | Admitting: Psychology

## 2020-12-22 ENCOUNTER — Ambulatory Visit (INDEPENDENT_AMBULATORY_CARE_PROVIDER_SITE_OTHER): Payer: 59 | Admitting: Psychology

## 2020-12-22 DIAGNOSIS — F603 Borderline personality disorder: Secondary | ICD-10-CM | POA: Diagnosis not present

## 2020-12-22 DIAGNOSIS — F4310 Post-traumatic stress disorder, unspecified: Secondary | ICD-10-CM

## 2021-01-04 ENCOUNTER — Ambulatory Visit: Payer: 59 | Admitting: Psychology

## 2021-01-05 ENCOUNTER — Ambulatory Visit: Payer: 59 | Admitting: Psychology

## 2021-01-12 ENCOUNTER — Other Ambulatory Visit: Payer: Self-pay | Admitting: Adult Health

## 2021-01-12 DIAGNOSIS — F3131 Bipolar disorder, current episode depressed, mild: Secondary | ICD-10-CM

## 2021-01-12 DIAGNOSIS — F4312 Post-traumatic stress disorder, chronic: Secondary | ICD-10-CM

## 2021-01-13 NOTE — Telephone Encounter (Signed)
Please schedule appt

## 2021-01-18 ENCOUNTER — Ambulatory Visit: Payer: 59 | Admitting: Psychology

## 2021-01-19 ENCOUNTER — Ambulatory Visit (INDEPENDENT_AMBULATORY_CARE_PROVIDER_SITE_OTHER): Payer: 59 | Admitting: Psychology

## 2021-01-19 DIAGNOSIS — F603 Borderline personality disorder: Secondary | ICD-10-CM | POA: Diagnosis not present

## 2021-01-19 DIAGNOSIS — F4310 Post-traumatic stress disorder, unspecified: Secondary | ICD-10-CM | POA: Diagnosis not present

## 2021-02-05 ENCOUNTER — Telehealth: Payer: Self-pay | Admitting: Adult Health

## 2021-02-05 NOTE — Telephone Encounter (Signed)
Noted  

## 2021-02-05 NOTE — Telephone Encounter (Signed)
[name] [name] called to report that this form is very important for his employment.  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  They have put him a week and he is concerned about losing this opportunity for employment.  He asked, please, to do ASAP and fax back to the [employer] distribution center.

## 2021-02-05 NOTE — Telephone Encounter (Signed)
Received [employer] Provider Questionnaire Form. Placed in Traci's box 02/05/21

## 2021-02-05 NOTE — Telephone Encounter (Signed)
I have the form today 9/16 and will complete as soon as I can then let Gina review and sign.

## 2021-02-08 NOTE — Telephone Encounter (Signed)
Left message to call back and discuss his form

## 2021-02-08 NOTE — Telephone Encounter (Signed)
Will have to contact pt because he's not been seen since May 2022 and did not follow up. No restrictions noted so not sure what the form is referring to.

## 2021-02-09 NOTE — Telephone Encounter (Signed)
Addendum to 02/05/21 message. [name] [name] would like for you to call him back regarding the form he sent here. His phone number is [phone].

## 2021-02-09 NOTE — Telephone Encounter (Signed)
Mom called and said that she would like her phone number to be called if you can't get [name] [name]. Her number is [phone]. She is calling back before 5 pm to find out the status. She said it needs to be faxed before 5 pm

## 2021-02-10 DIAGNOSIS — Z0289 Encounter for other administrative examinations: Secondary | ICD-10-CM

## 2021-02-11 ENCOUNTER — Ambulatory Visit (INDEPENDENT_AMBULATORY_CARE_PROVIDER_SITE_OTHER): Payer: 59 | Admitting: Psychology

## 2021-02-11 DIAGNOSIS — F4310 Post-traumatic stress disorder, unspecified: Secondary | ICD-10-CM

## 2021-02-11 DIAGNOSIS — F603 Borderline personality disorder: Secondary | ICD-10-CM

## 2021-02-16 IMAGING — CT CT RENAL STONE PROTOCOL
2 of 4 series · 15 of 46 positions shown, 17 images · non-contrast
Comparison: None.

CLINICAL DATA: Left flank pain

EXAM:
CT ABDOMEN AND PELVIS WITHOUT CONTRAST
TECHNIQUE: Multidetector CT imaging of the abdomen and pelvis was performed
following the standard protocol without oral or IV contrast.

[Series 2: axial st · axial · 0.79mm/px · z∈[-591,-151]mm · 12 of 105 slices shown, 14 images]
[im 9/105  soft-tissue]
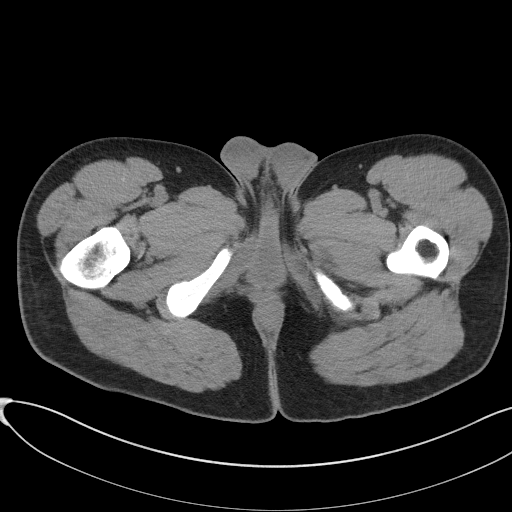
[im 9/105  bone]
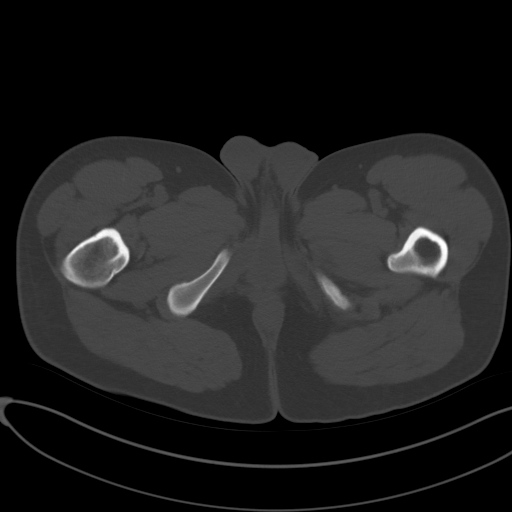
[im 17/105  soft-tissue]
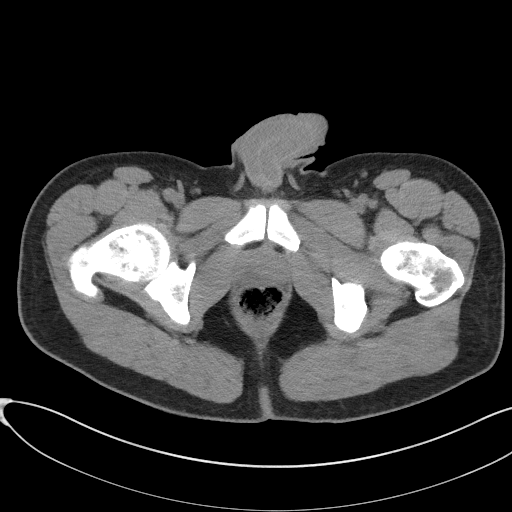
[im 25/105  soft-tissue]
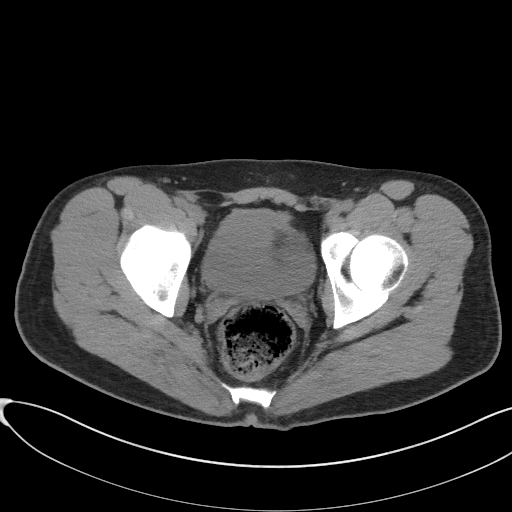
[im 33/105  soft-tissue]
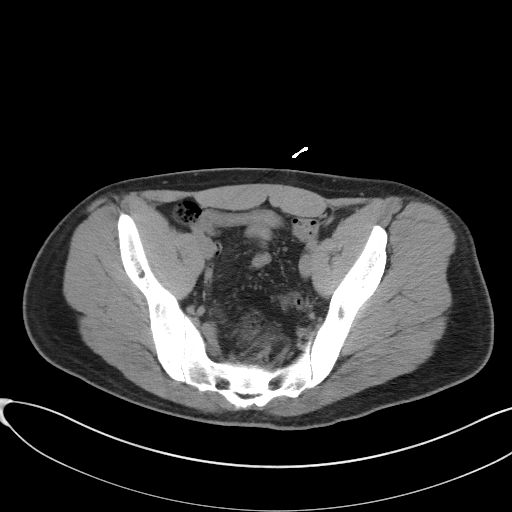
[im 41/105  soft-tissue]
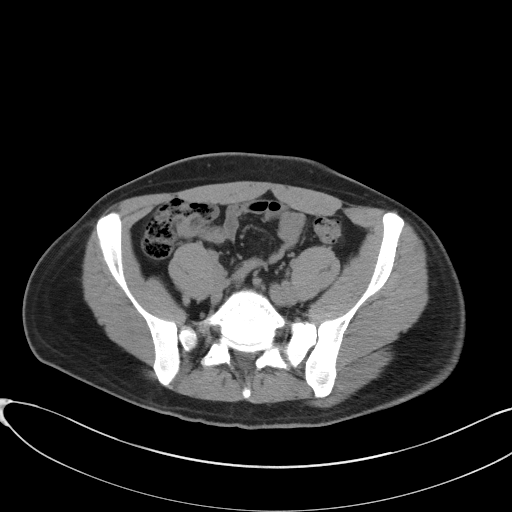
[im 49/105  soft-tissue]
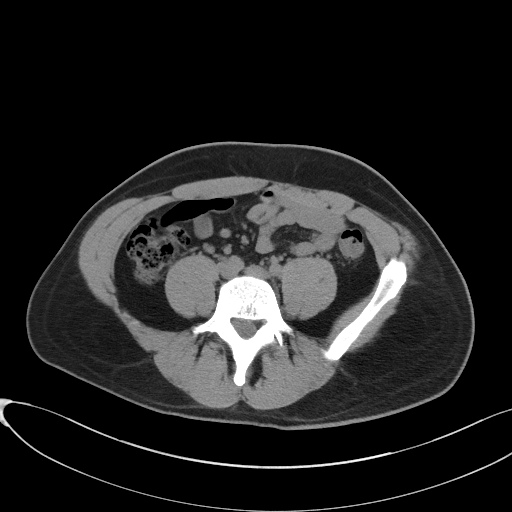
[im 57/105  soft-tissue]
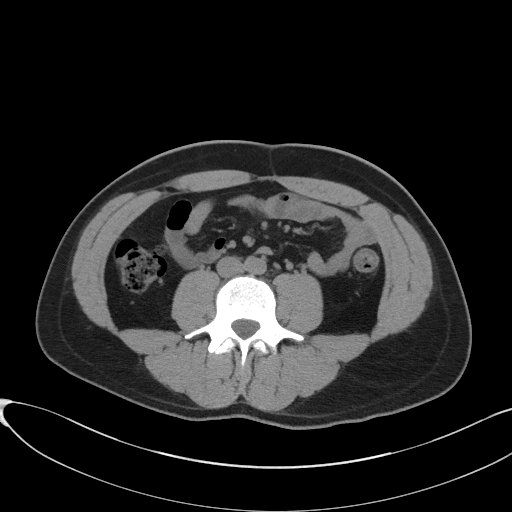
[im 65/105  soft-tissue]
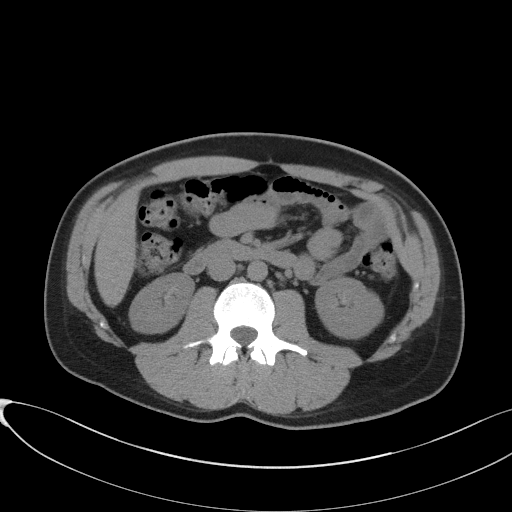
[im 73/105  soft-tissue]
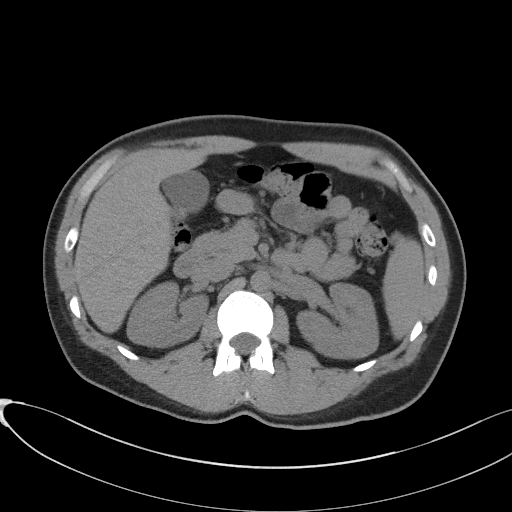
[im 73/105  bone]
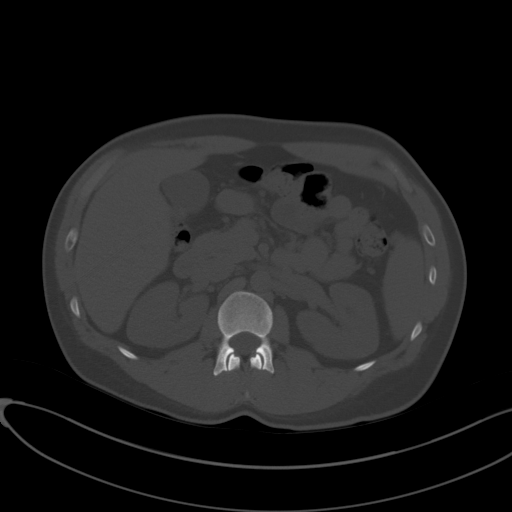
[im 81/105  soft-tissue]
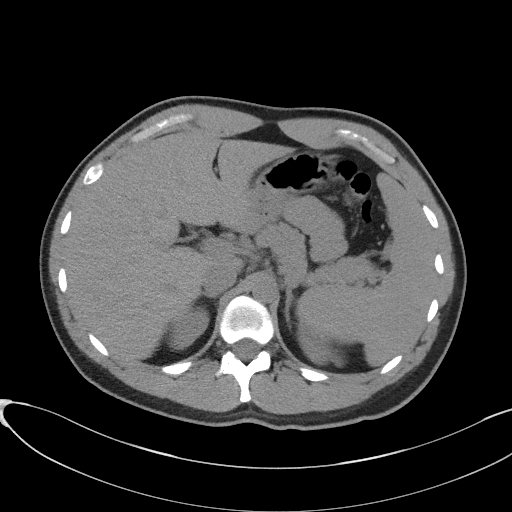
[im 89/105  soft-tissue]
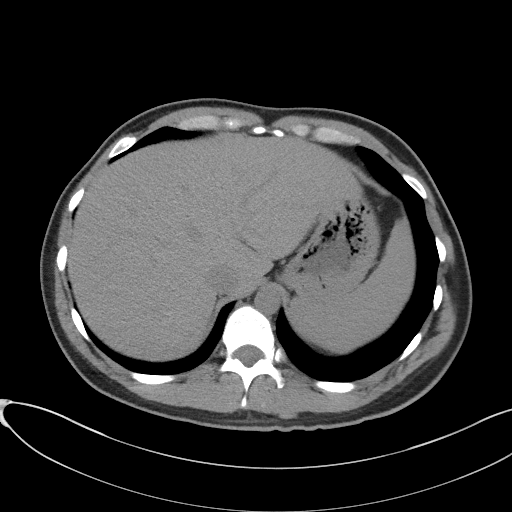
[im 97/105  soft-tissue]
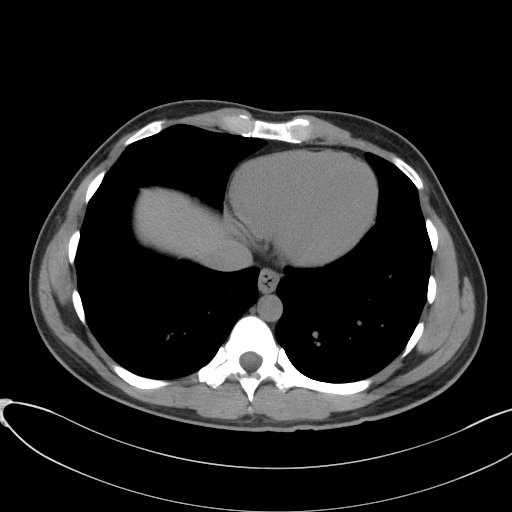

[Series 5: coronal st · coronal · 0.75mm/px · 3 of 90 slices shown]
[im 30/90  soft-tissue]
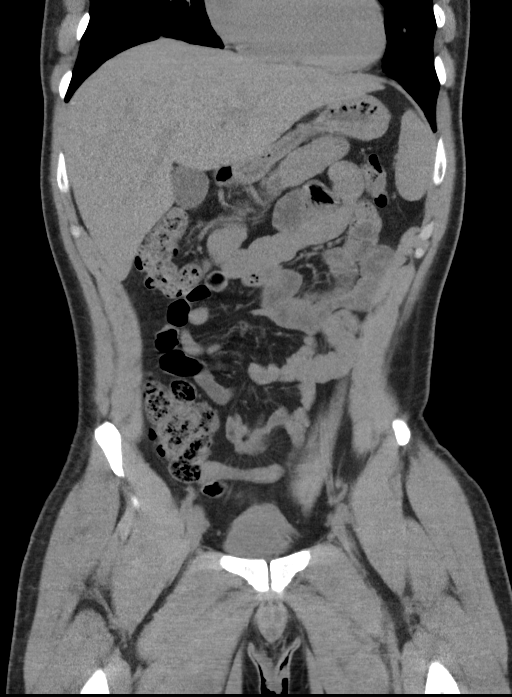
[im 40/90  soft-tissue]
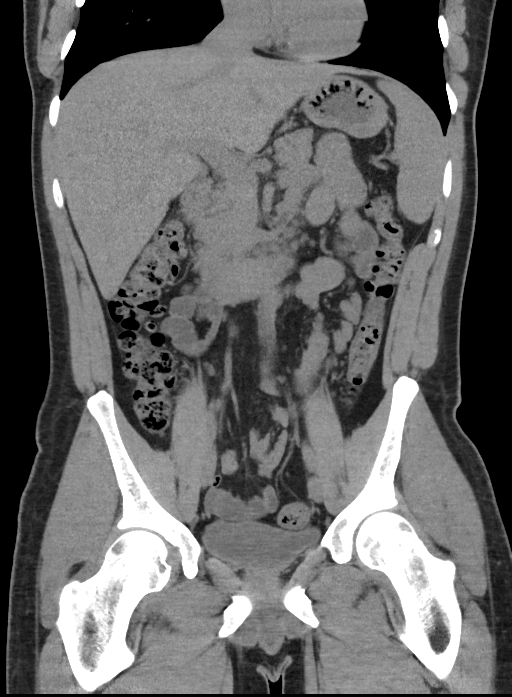
[im 50/90  soft-tissue]
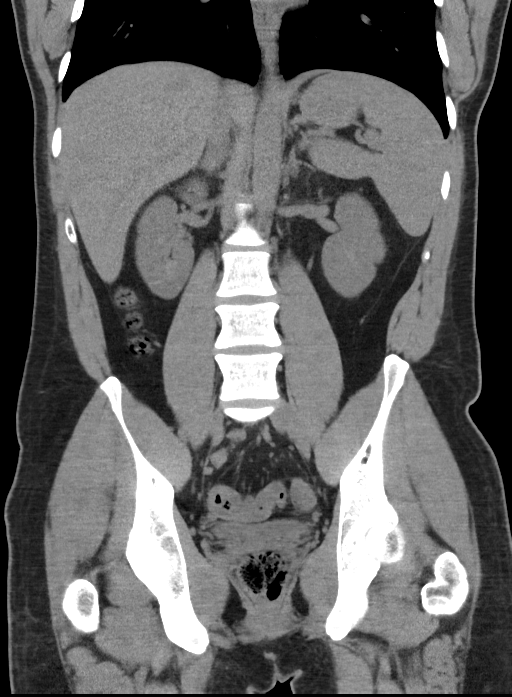

[15 of 46 positions shown; findings below may reference images not displayed]

FINDINGS: Lower chest: Lung bases are clear.

Hepatobiliary: No focal liver lesions are appreciable. The
gallbladder wall is not appreciably thickened. There is no biliary
duct dilatation.

Pancreas: There is no pancreatic mass or inflammatory focus.

Spleen: Spleen measures 14.9 x 10.8 x 4.8 cm with a measured splenic
volume of 386 cubic cm. No focal splenic lesions are appreciable.

Adrenals/Urinary Tract: Adrenals bilaterally appear normal. Kidneys
bilaterally show no evident mass or hydronephrosis on either side.
There is slight nephrocalcinosis bilaterally without well-defined
calculus on either side. There is no evident ureteral calculus on
either side. Urinary bladder is midline with wall thickness within
normal limits.

Stomach/Bowel: There is moderate stool in the colon. There is no
appreciable bowel wall or mesenteric thickening. There is no evident
bowel obstruction. The terminal ileum appears normal. There is no
demonstrable free air or portal venous air.

Vascular/Lymphatic: There is no abdominal aortic aneurysm. No
vascular lesions are evident on this noncontrast enhanced study. No
adenopathy is appreciable in the abdomen or pelvis.

Reproductive: Prostate and seminal vesicles are normal in size and
contour. No evident pelvic mass.

Other: Appendix appears normal. There is no evident abscess or
ascites in the abdomen or pelvis.

Musculoskeletal: No blastic or lytic bone lesions. No intramuscular
or abdominal wall lesions are evident.
IMPRESSION: 1. Slight nephrocalcinosis bilaterally without well-defined
calculus. No ureterectasis on either side. No evident
hydronephrosis. Urinary bladder wall thickness normal.

2. No bowel wall thickening or bowel obstruction. No abscess in the
abdomen or pelvis. Appendix appears normal.

3. Prominent spleen without focal splenic lesion evident on
noncontrast enhanced study.

## 2021-02-19 ENCOUNTER — Ambulatory Visit (INDEPENDENT_AMBULATORY_CARE_PROVIDER_SITE_OTHER): Payer: 59 | Admitting: Psychology

## 2021-02-19 DIAGNOSIS — F4310 Post-traumatic stress disorder, unspecified: Secondary | ICD-10-CM

## 2021-02-19 DIAGNOSIS — F603 Borderline personality disorder: Secondary | ICD-10-CM | POA: Diagnosis not present

## 2021-02-26 ENCOUNTER — Ambulatory Visit (INDEPENDENT_AMBULATORY_CARE_PROVIDER_SITE_OTHER): Payer: 59 | Admitting: Psychology

## 2021-02-26 DIAGNOSIS — F4310 Post-traumatic stress disorder, unspecified: Secondary | ICD-10-CM

## 2021-02-26 DIAGNOSIS — F603 Borderline personality disorder: Secondary | ICD-10-CM | POA: Diagnosis not present

## 2021-03-03 ENCOUNTER — Other Ambulatory Visit: Payer: Self-pay

## 2021-03-03 ENCOUNTER — Ambulatory Visit (INDEPENDENT_AMBULATORY_CARE_PROVIDER_SITE_OTHER): Payer: 59 | Admitting: Adult Health

## 2021-03-03 ENCOUNTER — Encounter: Payer: Self-pay | Admitting: Adult Health

## 2021-03-03 DIAGNOSIS — F3131 Bipolar disorder, current episode depressed, mild: Secondary | ICD-10-CM | POA: Diagnosis not present

## 2021-03-03 DIAGNOSIS — F902 Attention-deficit hyperactivity disorder, combined type: Secondary | ICD-10-CM

## 2021-03-03 DIAGNOSIS — F331 Major depressive disorder, recurrent, moderate: Secondary | ICD-10-CM

## 2021-03-03 DIAGNOSIS — F5105 Insomnia due to other mental disorder: Secondary | ICD-10-CM

## 2021-03-03 DIAGNOSIS — F411 Generalized anxiety disorder: Secondary | ICD-10-CM

## 2021-03-03 DIAGNOSIS — F4312 Post-traumatic stress disorder, chronic: Secondary | ICD-10-CM | POA: Diagnosis not present

## 2021-03-03 MED ORDER — RISPERIDONE 2 MG PO TABS: ORAL_TABLET | ORAL | 2 refills | Status: DC

## 2021-03-03 MED ORDER — MIRTAZAPINE 15 MG PO TABS: 15.0000 mg | ORAL_TABLET | Freq: Every day | ORAL | 2 refills | Status: DC

## 2021-03-03 NOTE — Progress Notes (Signed)
Nobel Brar [id number] 08-06-1990 30 y.o.  Subjective:   Patient ID:  [name] [name] is a 30 y.o. (DOB 03/04/1991) male.  Chief Complaint: No chief complaint on file.   HPI [name] [name] presents to the office today for follow-up of PTSD, ADHD, GAD, MDD, BPD 1, and insomia.   Describes mood today as "not too good". Pleasant. Mood symptoms - reports decreased depression, anxiety, and irritability. Doing better over the last week after restarting his medications. Stating "the medications are working". Feels more focused and calmer. Working out for the past 3 days. Plans to start at a part time job after [employer]. Mother continues to be supportive. Improved interest and motivation. Taking medications as prescribed.  Energy levels vary. Active, has a regular exercise routine. Enjoys some usual interests and activities. Single. Mother local. Spending time with family. Appetite adequate. Weight gain since restarting medications. Sleep has improved. Averages 8 hours. Focus and concentration stable. Completing tasks. Managing aspects of household. Starting a new job at [employer]. Denies SI or HI.  Denies AH or VH. Therapist - Bambi Cottle - 2 months.  Previous medication trials: Latuda, Trazadone  Review of Systems:  Review of Systems  Musculoskeletal:  Negative for gait problem.  Neurological:  Negative for tremors.  Psychiatric/Behavioral:         Please refer to HPI   Medications: I have reviewed the patient's current medications.  Current Outpatient Medications  Medication Sig Dispense Refill   methocarbamol (ROBAXIN) 500 MG tablet Take 1 tablet (500 mg total) by mouth 2 (two) times daily as needed for muscle spasms. 10 tablet 0   mirtazapine (REMERON) 15 MG tablet Take 1 tablet (15 mg total) by mouth at bedtime. 30 tablet 2   risperiDONE (RISPERDAL) 2 MG tablet Take one tablet at bedtime. 60 tablet 2   No current facility-administered medications for this visit.    Medication Side  Effects: None  Allergies:  Allergies  Allergen Reactions   Bee Venom Hives    Past Medical History:  Diagnosis Date   Bipolar 1 disorder (HCC)    PTSD (post-traumatic stress disorder)     Past Medical History, Surgical history, Social history, and Family history were reviewed and updated as appropriate.   Please see review of systems for further details on the patient's review from today.   Objective:   Physical Exam:  There were no vitals taken for this visit.  Physical Exam Constitutional:      General: He is not in acute distress. Musculoskeletal:        General: No deformity.  Neurological:     Mental Status: He is alert and oriented to person, place, and time.     Coordination: Coordination normal.  Psychiatric:        Attention and Perception: Attention and perception normal. He does not perceive auditory or visual hallucinations.        Mood and Affect: Mood normal. Mood is not anxious or depressed. Affect is not labile, blunt, angry or inappropriate.        Speech: Speech normal.        Behavior: Behavior normal.        Thought Content: Thought content normal. Thought content is not paranoid or delusional. Thought content does not include homicidal or suicidal ideation. Thought content does not include homicidal or suicidal plan.        Cognition and Memory: Cognition and memory normal.        Judgment: Judgment normal.  Comments: Insight intact    Lab Review:     Component Value Date/Time   NA 137 09/18/2014 1420   K 3.6 09/18/2014 1420   CL 103 09/18/2014 1420   CO2 22 09/18/2014 1420   GLUCOSE 100 (H) 09/18/2014 1420   BUN 9 09/18/2014 1420   CREATININE 0.86 09/18/2014 1420   CALCIUM 10.0 09/18/2014 1420   PROT 7.9 09/18/2014 1420   ALBUMIN 4.4 09/18/2014 1420   AST 31 09/18/2014 1420   ALT 36 09/18/2014 1420   ALKPHOS 50 09/18/2014 1420   BILITOT 0.5 09/18/2014 1420   GFRNONAA >90 09/18/2014 1420   GFRAA >90 09/18/2014 1420        Component Value Date/Time   WBC 6.2 09/18/2014 1420   RBC 4.85 09/18/2014 1420   HGB 15.6 09/18/2014 1420   HCT 42.4 09/18/2014 1420   PLT 218 09/18/2014 1420   MCV 87.4 09/18/2014 1420   MCH 32.2 09/18/2014 1420   MCHC 36.8 (H) 09/18/2014 1420   RDW 12.5 09/18/2014 1420    No results found for: POCLITH, LITHIUM   Lab Results  Component Value Date   VALPROATE 58.5 07/01/2009     .res Assessment: Plan:    Plan:  PDMP reviewed  1. Risperdal 2mg  daily. 2. Remeron 15mg  at hs   RTC 4 weeks  Patient advised to contact office with any questions, adverse effects, or acute worsening in signs and symptoms.  Discussed potential metabolic side effects associated with atypical antipsychotics, as well as potential risk for movement side effects. Advised pt to contact office if movement side effects occur.   Diagnoses and all orders for this visit:  Generalized anxiety disorder  Chronic post-traumatic stress disorder -     mirtazapine (REMERON) 15 MG tablet; Take 1 tablet (15 mg total) by mouth at bedtime. -     risperiDONE (RISPERDAL) 2 MG tablet; Take one tablet at bedtime.  Bipolar I disorder, mild, current or most recent episode depressed, with mixed features (HCC) -     mirtazapine (REMERON) 15 MG tablet; Take 1 tablet (15 mg total) by mouth at bedtime. -     risperiDONE (RISPERDAL) 2 MG tablet; Take one tablet at bedtime.  Attention deficit hyperactivity disorder (ADHD), combined type, moderate  Major depressive disorder, recurrent episode, moderate (HCC)  Insomnia due to mental condition    Please see After Visit Summary for patient specific instructions.  Future Appointments  Date Time Provider Department Center  03/05/2021  1:00 PM Cottle, , LCSW [facility] None  03/31/2021  4:20 PM Aksel Bencomo, Lynnell Dike, NP [facility] None  04/02/2021 11:00 AM Cottle, Bambi G, LCSW [facility] None    No orders of the defined types were placed in this  encounter.   -------------------------------

## 2021-03-05 ENCOUNTER — Ambulatory Visit (INDEPENDENT_AMBULATORY_CARE_PROVIDER_SITE_OTHER): Payer: 59 | Admitting: Psychology

## 2021-03-05 ENCOUNTER — Telehealth: Payer: Self-pay | Admitting: Adult Health

## 2021-03-05 DIAGNOSIS — F603 Borderline personality disorder: Secondary | ICD-10-CM | POA: Diagnosis not present

## 2021-03-05 DIAGNOSIS — F4310 Post-traumatic stress disorder, unspecified: Secondary | ICD-10-CM

## 2021-03-05 DIAGNOSIS — F3132 Bipolar disorder, current episode depressed, moderate: Secondary | ICD-10-CM

## 2021-03-05 NOTE — Telephone Encounter (Signed)
Pt LVM stating he needs to know what to do because he can't wake up and function on his meds.  Next appt 11/9

## 2021-03-05 NOTE — Telephone Encounter (Signed)
Pt forgot to report he has a hard time getting out of the bed in the morning, sleepy and is thinking it might be the medication. Once he gets up he's okay but getting up is an obstacle.

## 2021-03-08 NOTE — Telephone Encounter (Signed)
He just restarted the medication, so there may be a period of adjustment.

## 2021-03-09 NOTE — Telephone Encounter (Signed)
LM for him to call back with an update if still having issues.

## 2021-03-31 ENCOUNTER — Ambulatory Visit: Payer: 59 | Admitting: Adult Health

## 2021-03-31 NOTE — Progress Notes (Signed)
Patient no show appointment. ? ?

## 2021-04-02 ENCOUNTER — Ambulatory Visit: Payer: 59 | Admitting: Psychology

## 2021-04-08 ENCOUNTER — Ambulatory Visit (INDEPENDENT_AMBULATORY_CARE_PROVIDER_SITE_OTHER): Payer: 59 | Admitting: Psychology

## 2021-04-08 DIAGNOSIS — F3132 Bipolar disorder, current episode depressed, moderate: Secondary | ICD-10-CM

## 2021-04-08 DIAGNOSIS — F602 Antisocial personality disorder: Secondary | ICD-10-CM

## 2021-04-20 ENCOUNTER — Ambulatory Visit: Payer: 59 | Admitting: Psychology

## 2021-05-06 ENCOUNTER — Ambulatory Visit: Payer: 59 | Admitting: Psychology

## 2021-05-20 ENCOUNTER — Ambulatory Visit (INDEPENDENT_AMBULATORY_CARE_PROVIDER_SITE_OTHER): Payer: 59 | Admitting: Psychology

## 2021-05-20 DIAGNOSIS — F319 Bipolar disorder, unspecified: Secondary | ICD-10-CM

## 2021-05-20 DIAGNOSIS — F603 Borderline personality disorder: Secondary | ICD-10-CM

## 2021-05-20 NOTE — Progress Notes (Signed)
[facility] Counselor/Therapist Progress Note  Patient ID: [name] [name], MRN: [id number],    Date: 05/20/2021  Time Spent: 30 minutes  Treatment Type: Individual Therapy  Reported Symptoms: agitated, pacing, pressured speech  Mental Status Exam: Appearance:  Casual     Behavior: Monopolizing  Motor: Restlestness  Speech/Language:  Pressured  Affect: Appropriate  Mood: normal  Thought process: goal directed  Thought content:   WNL  Sensory/Perceptual disturbances:   WNL  Orientation: oriented to person, place, time/date, and situation  Attention: Fair  Concentration: Good  Memory: WNL  Fund of knowledge:  Fair  Insight:   Poor  Judgment:  Poor  Impulse Control: Poor   Risk Assessment: Danger to Self:  No Self-injurious Behavior: No Danger to Others: No Duty to Warn:no Physical Aggression / Violence:No  Access to Firearms a concern: No  Gang Involvement:No   Subjective: The patient attended a face-to-face individual therapy via video visit today.  The patient gave verbal consent for the session to be on video on WebEx.  The patient was in his backyard and the therapist was in her office.  The patient has reconnected with his ex, [name].  The patient talked today about being frustrated because onyx is not discontinuing relationships with previous lovers.  The patient seems intent on trying to control onyx and what she does.  Attempted to help the patient understand that the only behavior he can control is his own and that he can only express how he feels about it and then he may have to make a decision.  Practiced some communication skills so that the patient knows how to present this information to his partner.  Utilize cognitive behavioral therapy and practice communication skills.  Interventions: Cognitive Behavioral Therapy and Assertiveness/Communication  Diagnosis:No diagnosis found.  Plan: Please see Treatment plan in Therapy Charts with target date of  12/23/2021.  The patient approved this plan.  Patient is maintaining at this point in time.  Avagail Whittlesey G Honi Name, LCSW

## 2021-05-27 ENCOUNTER — Ambulatory Visit (INDEPENDENT_AMBULATORY_CARE_PROVIDER_SITE_OTHER): Payer: 59 | Admitting: Psychology

## 2021-05-27 DIAGNOSIS — F4312 Post-traumatic stress disorder, chronic: Secondary | ICD-10-CM

## 2021-05-27 DIAGNOSIS — F319 Bipolar disorder, unspecified: Secondary | ICD-10-CM

## 2021-05-27 DIAGNOSIS — F603 Borderline personality disorder: Secondary | ICD-10-CM

## 2021-05-27 NOTE — Progress Notes (Signed)
[facility] Counselor/Therapist Progress Note  Patient ID: [name] [name], MRN: [id number],    Date: 05/27/2021  Time Spent: 60 minutes  Treatment Type: Individual Therapy  Reported Symptoms: agitated,  pressured speech  Mental Status Exam: Appearance:  Casual     Behavior: Monopolizing  Motor: Restlestness  Speech/Language:  Pressured  Affect: Appropriate  Mood: normal  Thought process: goal directed  Thought content:   WNL  Sensory/Perceptual disturbances:   WNL  Orientation: oriented to person, place, time/date, and situation  Attention: Fair  Concentration: Good  Memory: WNL  Fund of knowledge:  Fair  Insight:   Poor  Judgment:  Poor  Impulse Control: Poor   Risk Assessment: Danger to Self:  No Self-injurious Behavior: No Danger to Others: No Duty to Warn:no Physical Aggression / Violence:No  Access to Firearms a concern: No  Gang Involvement:No   Subjective: The patient attended a face-to-face individual therapy via video visit today.  The patient gave verbal consent for the session to be on video on WebEx.  The patient was in his backyard and the therapist was in her office.  The patient had contacted me yesterday wanting to include his partner in the therapy session today however, I explained that if we were going to have a conjoint session that we would need to have that planned out and know what we were talking about prior to having a session.  He got on the session today and is struggling because he wants a committed relationship with his partner and his partner continues to basically prostitute herself to make money.  The patient is struggling with this as he wants her attention only to be given to him.  Reframed the circumstances for the patient to help him understand that he is going to have to focus on self care and finding a job so that he feels better about himself.  They seem to be back in the same dynamic that they had previously prior to the  break-up.  They reconnected because his partner [name] like a picture of him when he was working.  I explained that he needs to focus on changing himself as opposed to changing her.  I told him that if we did a conjoint session that we would need to have specific goals that we were working towards in order to be able to have her in the session.   Interventions: Cognitive Behavioral Therapy and Assertiveness/Communication  Diagnosis:Bipolar I disorder (HCC)  Borderline personality disorder (HCC)  Chronic post-traumatic stress disorder  Plan: Please see Treatment plan in Therapy Charts with target date of 12/23/2021.  The patient approved this plan.  Patient is maintaining at this point in time.  Tagan Bartram G Maudie Shingledecker, LCSW

## 2021-06-01 ENCOUNTER — Other Ambulatory Visit: Payer: Self-pay | Admitting: Adult Health

## 2021-06-01 DIAGNOSIS — F3131 Bipolar disorder, current episode depressed, mild: Secondary | ICD-10-CM

## 2021-06-01 DIAGNOSIS — F4312 Post-traumatic stress disorder, chronic: Secondary | ICD-10-CM

## 2021-06-01 NOTE — Telephone Encounter (Signed)
Please call to schedule an appt. Was a no show 11/9.

## 2021-06-04 NOTE — Telephone Encounter (Signed)
LVM to schedule follow up

## 2021-06-10 ENCOUNTER — Ambulatory Visit (INDEPENDENT_AMBULATORY_CARE_PROVIDER_SITE_OTHER): Payer: 59 | Admitting: Psychology

## 2021-06-10 DIAGNOSIS — F4312 Post-traumatic stress disorder, chronic: Secondary | ICD-10-CM | POA: Diagnosis not present

## 2021-06-10 DIAGNOSIS — F319 Bipolar disorder, unspecified: Secondary | ICD-10-CM

## 2021-06-10 DIAGNOSIS — F603 Borderline personality disorder: Secondary | ICD-10-CM | POA: Diagnosis not present
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
## 2021-06-16 ENCOUNTER — Ambulatory Visit: Payer: 59 | Admitting: Psychology

## 2021-06-24 ENCOUNTER — Ambulatory Visit (INDEPENDENT_AMBULATORY_CARE_PROVIDER_SITE_OTHER): Payer: 59 | Admitting: Psychology

## 2021-06-24 DIAGNOSIS — F4312 Post-traumatic stress disorder, chronic: Secondary | ICD-10-CM | POA: Diagnosis not present

## 2021-06-24 DIAGNOSIS — F319 Bipolar disorder, unspecified: Secondary | ICD-10-CM | POA: Diagnosis not present

## 2021-06-24 DIAGNOSIS — F603 Borderline personality disorder: Secondary | ICD-10-CM

## 2021-06-24 NOTE — Progress Notes (Signed)
[facility] Counselor/Therapist Progress Note  Patient ID: [name] [name], MRN: [id number],    Date: 06/24/2021  Time Spent: 60 minutes  Treatment Type: Individual Therapy  Reported Symptoms: agitated,  pressured speech  Mental Status Exam: Appearance:  Casual     Behavior: Monopolizing  Motor: Restlestness  Speech/Language:  Pressured  Affect: Appropriate  Mood: normal  Thought process: goal directed  Thought content:   WNL  Sensory/Perceptual disturbances:   WNL  Orientation: oriented to person, place, time/date, and situation  Attention: Fair  Concentration: Good  Memory: WNL  Fund of knowledge:  Fair  Insight:   Poor  Judgment:  Poor  Impulse Control: Poor   Risk Assessment: Danger to Self:  No Self-injurious Behavior: No Danger to Others: No Duty to Warn:no Physical Aggression / Violence:No  Access to Firearms a concern: No  Gang Involvement:No   Subjective: The patient attended a face-to-face individual therapy via video visit today.  The patient gave verbal consent for the session to be on video on WebEx.  The patient was in his backyard and the therapist was in her office.  The patient presents as pleasant and cooperative today.  The patient was much more calm today than he was the last time I saw him.  The patient states that he continues to take his medication.  He says that he is more depressed today.  We talked today about his relationship and he continues to want her to change and not have other relationships outside of their relationship.  The patient talked about still not having a job and things seem to have settled down somewhat with his mother.  I encouraged the patient to continue to try to take care of himself and to look for a job so that he would feel better about himself and feel more productive.  The patient is very focused on this relationship and whether it is going to make it or not.  He does not like that she has relationships with other  men and that she sometimes will sell herself for money.  We will continue to encourage him to take care of of himself and focus on making himself the best he can be. Interventions: Cognitive Behavioral Therapy and Assertiveness/Communication  Diagnosis:Bipolar I disorder (HCC)  Borderline personality disorder (HCC)  Chronic post-traumatic stress disorder  Plan: Please see Treatment plan in Therapy Charts with target date of 12/23/2021.  The patient approved this plan.  Patient is maintaining at this point in time.  [name] Boehle [name], LCSW                  [name] Kosinski G [name], LCSW               [name] Ghee G [name], LCSW

## 2021-07-08 ENCOUNTER — Ambulatory Visit (INDEPENDENT_AMBULATORY_CARE_PROVIDER_SITE_OTHER): Payer: 59 | Admitting: Psychology

## 2021-07-08 DIAGNOSIS — F603 Borderline personality disorder: Secondary | ICD-10-CM

## 2021-07-08 DIAGNOSIS — F4312 Post-traumatic stress disorder, chronic: Secondary | ICD-10-CM

## 2021-07-08 DIAGNOSIS — F319 Bipolar disorder, unspecified: Secondary | ICD-10-CM

## 2021-07-08 NOTE — Progress Notes (Signed)
[facility] Counselor/Therapist Progress Note  Patient ID: [name] [name], MRN: [id number],    Date: 07/08/2021  Time Spent: 50 minutes  Treatment Type: Individual Therapy  Reported Symptoms: agitated,  pressured speech  Mental Status Exam: Appearance:  Casual     Behavior: Monopolizing  Motor: Restlestness  Speech/Language:  Pressured  Affect: Appropriate  Mood: normal  Thought process: goal directed  Thought content:   WNL  Sensory/Perceptual disturbances:   WNL  Orientation: oriented to person, place, time/date, and situation  Attention: Fair  Concentration: Good  Memory: WNL  Fund of knowledge:  Fair  Insight:   Poor  Judgment:  Poor  Impulse Control: Poor   Risk Assessment: Danger to Self:  No Self-injurious Behavior: No Danger to Others: No Duty to Warn:no Physical Aggression / Violence:No  Access to Firearms a concern: No  Gang Involvement:No   Subjective: The patient attended a face-to-face individual therapy via video visit today.  The patient gave verbal consent for the session to be on video on WebEx.  The patient was in his backyard and the therapist was in her office.  The patient presents with a blunted affect and his mood is pleasant.  The patient reports that he feels like he is doing better this week however he is very tired because he did not get much sleep.  The patient states that he and [name] [name] are continuing their relationship and [name] [name] is going away for a few days.  The patient tends to get a little threatened by this because he feels like he needs to somewhat control her behavior.  We talked about how to manage that need to control her behavior and to try to enjoy himself alone.  The patient seems more stable and content as he has a lead on a janitorial job.  The patient should feel better about himself once he gets this position.  The patient states that he and his mother are doing better with the circumstance with him having to pay her back.   Provided supportive therapy today and gave him some positive feedback about how he is managing his emotions.  Interventions: Cognitive Behavioral Therapy and Assertiveness/Communication  Diagnosis:Bipolar I disorder (HCC)  Borderline personality disorder (HCC)  Chronic post-traumatic stress disorder  Plan: Please see Treatment plan in Therapy Charts with target date of 12/23/2021.  The patient approved this plan.  Patient is maintaining at this point in time.  Tondra Reierson G Islam Eichinger, LCSW                  Salem Mastrogiovanni G Nazaret Chea, LCSW               Shanya Ferriss G Sergei Delo, LCSW               Evanell Redlich G Kenzo Ozment, LCSW

## 2021-07-22 ENCOUNTER — Ambulatory Visit: Payer: 59 | Admitting: Psychology

## 2021-08-05 ENCOUNTER — Ambulatory Visit (INDEPENDENT_AMBULATORY_CARE_PROVIDER_SITE_OTHER): Payer: 59 | Admitting: Psychology

## 2021-08-05 DIAGNOSIS — F603 Borderline personality disorder: Secondary | ICD-10-CM | POA: Diagnosis not present

## 2021-08-05 DIAGNOSIS — F4312 Post-traumatic stress disorder, chronic: Secondary | ICD-10-CM

## 2021-08-05 DIAGNOSIS — F319 Bipolar disorder, unspecified: Secondary | ICD-10-CM | POA: Diagnosis not present
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
## 2021-08-06 ENCOUNTER — Telehealth: Payer: Self-pay | Admitting: Adult Health

## 2021-08-06 NOTE — Telephone Encounter (Signed)
Mom, Darel Hong, called to report that [name] [name] is spiraling down.  She is very concerned about him.  She needs to know if she should get him in career counseling, or needs to assess his living situation,  Should he been in a treatment center or on disability. His counselor is Bambi Cottle perhaps care should be coordinated with her.  Darel Hong just needs some advice of what to do.  Please call her. [phone] ?

## 2021-08-06 NOTE — Telephone Encounter (Signed)
No DPR on file. LVM that we are unable to discuss patient with her at this time.  ?

## 2021-08-19 ENCOUNTER — Ambulatory Visit (INDEPENDENT_AMBULATORY_CARE_PROVIDER_SITE_OTHER): Payer: 59 | Admitting: Psychology

## 2021-08-19 DIAGNOSIS — F319 Bipolar disorder, unspecified: Secondary | ICD-10-CM

## 2021-08-19 DIAGNOSIS — F4312 Post-traumatic stress disorder, chronic: Secondary | ICD-10-CM | POA: Diagnosis not present

## 2021-08-19 DIAGNOSIS — F603 Borderline personality disorder: Secondary | ICD-10-CM

## 2021-08-19 NOTE — Progress Notes (Signed)
[facility] Counselor/Therapist Progress Note ? ?Patient ID: [name] [name], MRN: [id number],   ? ?Date: 08/19/2021 ? ?Time Spent: 60 minutes ? ?Treatment Type: Individual Therapy ? ?Reported Symptoms: agitated,  pressured speech ? ?Mental Status Exam: ?Appearance:  Casual     ?Behavior: Monopolizing  ?Motor: Restlestness  ?Speech/Language:  Pressured  ?Affect: Appropriate  ?Mood: normal  ?Thought process: goal directed  ?Thought content:   WNL  ?Sensory/Perceptual disturbances:   WNL  ?Orientation: oriented to person, place, time/date, and situation  ?Attention: Fair  ?Concentration: Good  ?Memory: WNL  ?Fund of knowledge:  Fair  ?Insight:   Poor  ?Judgment:  Poor  ?Impulse Control: Poor  ? ?Risk Assessment: ?Danger to Self:  No ?Self-injurious Behavior: No ?Danger to Others: No ?Duty to Warn:no ?Physical Aggression / Violence:No  ?Access to Firearms a concern: No  ?Gang Involvement:No  ? ?Subjective: The patient attended a face-to-face individual therapy via video visit today.  The patient gave verbal consent for the session to be on video on WebEx.  The patient was in his house alone and the therapist was in her office.   The patient presents with a blunted affect and his mood is pleasant.  The patient reports that things seem to be a little better at the house where he is living.  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  We talked about the possibility of him contacting vocational rehabilitation so that he could potentially find a job or get placed in a job.  The patient reports that he has been doing okay and seems to be managing a little better than he was the last time I saw him.  The patient states that he has been trying to do a better job of taking care of himself. ? ?Interventions: Cognitive Behavioral Therapy and Assertiveness/Communication ? ?Diagnosis:Bipolar I disorder (HCC) ? ?Borderline personality disorder (HCC) ? ?Chronic post-traumatic stress  disorder ? ?Plan: Please see Treatment plan in Therapy Charts with target date of 12/23/2021.  The patient approved this plan.  Patient is maintaining at this point in time. ? ?Braidon Chermak G Winner Valeriano, LCSW ? ? ? ? ? ? ? ? ? ? ? ? ? ? ? ? ? ?Takara Sermons G Quanasia Defino, LCSW ? ? ? ? ? ? ? ? ? ? ? ? ? ? ?Rosio Weiss G Shatona Andujar, LCSW ? ? ? ? ? ? ? ? ? ? ? ? ? ? ?Damario Gillie G Alissa Pharr, LCSW ? ? ? ? ? ? ? ? ? ? ? ? ? ? ?Read Bonelli G Halana Deisher, LCSW ? ? ? ? ? ? ? ? ? ? ? ? ? ? ?Shriley Joffe G Django Nguyen, LCSW ?

## 2021-09-02 ENCOUNTER — Ambulatory Visit: Payer: 59 | Admitting: Psychology

## 2021-09-16 ENCOUNTER — Ambulatory Visit (INDEPENDENT_AMBULATORY_CARE_PROVIDER_SITE_OTHER): Payer: 59 | Admitting: Psychology

## 2021-09-16 DIAGNOSIS — F4312 Post-traumatic stress disorder, chronic: Secondary | ICD-10-CM

## 2021-09-16 DIAGNOSIS — F603 Borderline personality disorder: Secondary | ICD-10-CM

## 2021-09-16 DIAGNOSIS — F319 Bipolar disorder, unspecified: Secondary | ICD-10-CM
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
## 2021-09-24 ENCOUNTER — Other Ambulatory Visit: Payer: Self-pay | Admitting: Adult Health

## 2021-09-24 DIAGNOSIS — F4312 Post-traumatic stress disorder, chronic: Secondary | ICD-10-CM

## 2021-09-24 DIAGNOSIS — F3131 Bipolar disorder, current episode depressed, mild: Secondary | ICD-10-CM

## 2021-09-26 NOTE — Telephone Encounter (Signed)
Please call patient to schedule an appt. Last seen in October with RTC in 4-6 weeks, had a no show.  ?

## 2021-09-28 ENCOUNTER — Telehealth: Payer: Self-pay | Admitting: Adult Health

## 2021-09-28 DIAGNOSIS — F4312 Post-traumatic stress disorder, chronic: Secondary | ICD-10-CM

## 2021-09-28 DIAGNOSIS — F3131 Bipolar disorder, current episode depressed, mild: Secondary | ICD-10-CM

## 2021-09-28 MED ORDER — MIRTAZAPINE 15 MG PO TABS: ORAL_TABLET | ORAL | 0 refills | Status: DC

## 2021-09-28 MED ORDER — RISPERIDONE 2 MG PO TABS: ORAL_TABLET | ORAL | 2 refills | Status: DC

## 2021-09-28 NOTE — Telephone Encounter (Signed)
Scripts sent to the requested pharmacy. A refill request had been sent earlier for the mirtazapine to a different pharmacy. Called that pharmacy and canceled.  ?

## 2021-09-28 NOTE — Telephone Encounter (Signed)
Next appt is 10/08/21. [name] [name] is requesting a refill on Mirtazapine 15 mg and Risperidone 2 mg called to: ? ?[id number] - [place], [place] - [address] AT [facility] [address] ? ?Phone:  [phone]  ?Fax:  [phone]  ? ? ? ? ? ?

## 2021-09-29 ENCOUNTER — Other Ambulatory Visit: Payer: Self-pay | Admitting: Adult Health

## 2021-09-29 ENCOUNTER — Ambulatory Visit (INDEPENDENT_AMBULATORY_CARE_PROVIDER_SITE_OTHER): Payer: 59 | Admitting: Adult Health

## 2021-09-29 ENCOUNTER — Encounter: Payer: Self-pay | Admitting: Adult Health

## 2021-09-29 DIAGNOSIS — F411 Generalized anxiety disorder: Secondary | ICD-10-CM

## 2021-09-29 DIAGNOSIS — F331 Major depressive disorder, recurrent, moderate: Secondary | ICD-10-CM | POA: Diagnosis not present

## 2021-09-29 DIAGNOSIS — F5105 Insomnia due to other mental disorder: Secondary | ICD-10-CM

## 2021-09-29 DIAGNOSIS — F3131 Bipolar disorder, current episode depressed, mild: Secondary | ICD-10-CM

## 2021-09-29 DIAGNOSIS — F4312 Post-traumatic stress disorder, chronic: Secondary | ICD-10-CM

## 2021-09-29 MED ORDER — CARIPRAZINE HCL 1.5 MG PO CAPS: 1.5000 mg | ORAL_CAPSULE | Freq: Every day | ORAL | 2 refills | Status: DC

## 2021-09-29 MED ORDER — MIRTAZAPINE 15 MG PO TABS: ORAL_TABLET | ORAL | 5 refills | Status: DC

## 2021-09-29 NOTE — Progress Notes (Signed)
[name] [name] ?[id number] ?[birth year] ?31 y.o. ? ?Subjective:  ? ?Patient ID:  [name] [name] is a 31 y.o. (DOB 04-29-1991) male. ? ?Chief Complaint: No chief complaint on file. ? ? ?HPI ?Merton Border presents to the office today for follow-up of PTSD, GAD, MDD, BPD 1, and insomia.  ? ?Describes mood today as "not too good". Pleasant. Mood symptoms - reports depression, anxiety, and irritability. Reports mood fluctuations. Stating "I'm not doing to good" Has been taking Risperdal, but had to stop recently after his tongue swelled. Was seen at urgent care yesterday and was given steroids. Would like to switch to another medication to help manage mood. He and partner doing well. Mother continues to be supportive. Varying interest and motivation. Taking medications as prescribed.  ?Energy levels fine - sluggish some days. Active, has a regular exercise routine. ?Enjoys some usual interests and activities. Lives with his partner. Mother local. Spending time with family. ?Appetite adequate. Weight loss - 174 pounds. ?Sleep has improved. Averages 5 to 6 or 12 hours. ?Focus and concentration stable. Completing tasks. Managing aspects of household. Unemployed. ?Denies SI or HI.  ?Denies AH or VH. ?Therapist - Bambi Cottle - every 2 weeks. ? ?Previous medication trials: Latuda, Trazadone ? ? ?  ? ?Review of Systems:  ?Review of Systems  ?Musculoskeletal:  Negative for gait problem.  ?Neurological:  Negative for tremors.  ?Psychiatric/Behavioral:    ?     Please refer to HPI  ? ?Medications: I have reviewed the patient's current medications. ? ?Current Outpatient Medications  ?Medication Sig Dispense Refill  ? cariprazine (VRAYLAR) 1.5 MG capsule Take 1 capsule (1.5 mg total) by mouth daily. 30 capsule 2  ? methocarbamol (ROBAXIN) 500 MG tablet Take 1 tablet (500 mg total) by mouth 2 (two) times daily as needed for muscle spasms. 10 tablet 0  ? mirtazapine (REMERON) 15 MG tablet TAKE 1 TABLET BY MOUTH EVERYDAY AT BEDTIME 30 tablet  5  ? ?No current facility-administered medications for this visit.  ? ? ?Medication Side Effects: None ? ?Allergies:  ?Allergies  ?Allergen Reactions  ? Bee Venom Hives  ? ? ?Past Medical History:  ?Diagnosis Date  ? Bipolar 1 disorder (HCC)   ? PTSD (post-traumatic stress disorder)   ? ? ?Past Medical History, Surgical history, Social history, and Family history were reviewed and updated as appropriate.  ? ?Please see review of systems for further details on the patient's review from today.  ? ?Objective:  ? ?Physical Exam:  ?There were no vitals taken for this visit. ? ?Physical Exam ?Constitutional:   ?   General: He is not in acute distress. ?Musculoskeletal:     ?   General: No deformity.  ?Neurological:  ?   Mental Status: He is alert and oriented to person, place, and time.  ?   Coordination: Coordination normal.  ?Psychiatric:     ?   Attention and Perception: Attention and perception normal. He does not perceive auditory or visual hallucinations.     ?   Mood and Affect: Mood normal. Mood is not anxious or depressed. Affect is not labile, blunt, angry or inappropriate.     ?   Speech: Speech normal.     ?   Behavior: Behavior normal.     ?   Thought Content: Thought content normal. Thought content is not paranoid or delusional. Thought content does not include homicidal or suicidal ideation. Thought content does not include homicidal or suicidal plan.     ?  Cognition and Memory: Cognition and memory normal.     ?   Judgment: Judgment normal.  ?   Comments: Insight intact  ? ? ?Lab Review:  ?   ?Component Value Date/Time  ? NA 137 09/18/2014 1420  ? K 3.6 09/18/2014 1420  ? CL 103 09/18/2014 1420  ? CO2 22 09/18/2014 1420  ? GLUCOSE 100 (H) 09/18/2014 1420  ? BUN 9 09/18/2014 1420  ? CREATININE 0.86 09/18/2014 1420  ? CALCIUM 10.0 09/18/2014 1420  ? PROT 7.9 09/18/2014 1420  ? ALBUMIN 4.4 09/18/2014 1420  ? AST 31 09/18/2014 1420  ? ALT 36 09/18/2014 1420  ? ALKPHOS 50 09/18/2014 1420  ? BILITOT 0.5  09/18/2014 1420  ? GFRNONAA >90 09/18/2014 1420  ? GFRAA >90 09/18/2014 1420  ? ? ?   ?Component Value Date/Time  ? WBC 6.2 09/18/2014 1420  ? RBC 4.85 09/18/2014 1420  ? HGB 15.6 09/18/2014 1420  ? HCT 42.4 09/18/2014 1420  ? PLT 218 09/18/2014 1420  ? MCV 87.4 09/18/2014 1420  ? MCH 32.2 09/18/2014 1420  ? MCHC 36.8 (H) 09/18/2014 1420  ? RDW 12.5 09/18/2014 1420  ? ? ?No results found for: POCLITH, LITHIUM  ? ?Lab Results  ?Component Value Date  ? VALPROATE 58.5 07/01/2009  ?  ? ?.res ?Assessment: Plan:   ? ?Plan: ? ?PDMP reviewed ? ?D/C Risperdal 2mg  daily - tongue swelling ?Add Vraylar 1.5mg  - samples given ? ?Remeron 15mg  at hs  ? ?RTC 4 weeks ? ?Spoke with mother about patient - voicing concerns about her son's state of mind and living situation.  ? ?Patient advised to contact office with any questions, adverse effects, or acute worsening in signs and symptoms. ? ?Discussed potential metabolic side effects associated with atypical antipsychotics, as well as potential risk for movement side effects. Advised pt to contact office if movement side effects occur.  ? ?Diagnoses and all orders for this visit: ? ?Chronic post-traumatic stress disorder ?-     mirtazapine (REMERON) 15 MG tablet; TAKE 1 TABLET BY MOUTH EVERYDAY AT BEDTIME ? ?Generalized anxiety disorder ?-     cariprazine (VRAYLAR) 1.5 MG capsule; Take 1 capsule (1.5 mg total) by mouth daily. ? ?Insomnia due to mental condition ?-     cariprazine (VRAYLAR) 1.5 MG capsule; Take 1 capsule (1.5 mg total) by mouth daily. ? ?Major depressive disorder, recurrent episode, moderate (HCC) ?-     cariprazine (VRAYLAR) 1.5 MG capsule; Take 1 capsule (1.5 mg total) by mouth daily. ? ?Bipolar I disorder, mild, current or most recent episode depressed, with mixed features (HCC) ? ?  ? ?Please see After Visit Summary for patient specific instructions. ? ?Future Appointments  ?Date Time Provider Department Center  ?09/30/2021 12:00 PM Cottle, Bambi G, LCSW [facility] None   ?10/08/2021 12:00 PM Terek Bee, Thereasa Soloegina Nattalie, NP [facility] None  ?10/14/2021 12:00 PM Cottle, Bambi G, LCSW [facility] None  ?10/28/2021 12:00 PM Cottle, Bambi G, LCSW [facility] None  ?11/11/2021 12:00 PM Cottle, Bambi G, LCSW [facility] None  ?11/25/2021 12:00 PM Cottle, Bambi G, LCSW [facility] None  ?12/09/2021 12:00 PM Cottle, Bambi G, LCSW [facility] None  ?12/23/2021 12:00 PM Cottle, Bambi G, LCSW [facility] None  ?01/06/2022 12:00 PM Cottle, Bambi G, LCSW [facility] None  ?01/20/2022 12:00 PM Cottle, Bambi G, LCSW [facility] None  ?02/03/2022 12:00 PM Cottle, Bambi G, LCSW [facility] None  ?03/03/2022 12:00 PM Cottle, Bambi G, LCSW [facility] None  ?03/17/2022 12:00 PM Cottle, Bambi G, LCSW [facility] None  ?  03/31/2022 12:00 PM Cottle, Bambi G, LCSW [facility] None  ?04/28/2022 12:00 PM Cottle, Bambi G, LCSW [facility] None  ?05/12/2022 12:00 PM Cottle, Bambi G, LCSW [facility] None  ? ? ?No orders of the defined types were placed in this encounter. ? ? ?------------------------------- ?

## 2021-09-29 NOTE — Telephone Encounter (Signed)
Needs PA 

## 2021-09-30 ENCOUNTER — Ambulatory Visit: Payer: 59 | Admitting: Psychology

## 2021-10-01 NOTE — Telephone Encounter (Signed)
PA pending

## 2021-10-05 ENCOUNTER — Ambulatory Visit (INDEPENDENT_AMBULATORY_CARE_PROVIDER_SITE_OTHER): Payer: 59 | Admitting: Psychology

## 2021-10-05 DIAGNOSIS — F4312 Post-traumatic stress disorder, chronic: Secondary | ICD-10-CM | POA: Diagnosis not present

## 2021-10-05 DIAGNOSIS — F411 Generalized anxiety disorder: Secondary | ICD-10-CM

## 2021-10-05 DIAGNOSIS — F603 Borderline personality disorder: Secondary | ICD-10-CM | POA: Diagnosis not present

## 2021-10-05 DIAGNOSIS — F319 Bipolar disorder, unspecified: Secondary | ICD-10-CM

## 2021-10-05 NOTE — Telephone Encounter (Signed)
PA denied initially, I will review requirements and hopefully get an approval ?

## 2021-10-05 NOTE — Progress Notes (Signed)
[facility] Counselor/Therapist Progress Note ? ?Patient ID: [name] [name], MRN: [id number],   ? ?Date: 10/05/2021 ? ?Time Spent: 60 minutes ? ?Treatment Type: Individual Therapy ? ?Reported Symptoms: agitated,  pressured speech ? ?Mental Status Exam: ?Appearance:  Casual     ?Behavior: Monopolizing  ?Motor: Restlestness  ?Speech/Language:  Pressured  ?Affect: Appropriate  ?Mood: normal  ?Thought process: tangential  ?Thought content:   WNL  ?Sensory/Perceptual disturbances:   WNL  ?Orientation: oriented to person, place, time/date, and situation  ?Attention: Fair  ?Concentration: Good  ?Memory: WNL  ?Fund of knowledge:  Fair  ?Insight:   Poor  ?Judgment:  Poor  ?Impulse Control: Poor  ? ?Risk Assessment: ?Danger to Self:  No ?Self-injurious Behavior: No ?Danger to Others: No ?Duty to Warn:no ?Physical Aggression / Violence:No  ?Access to Firearms a concern: No  ?Gang Involvement:No  ? ?Subjective: The patient attended a face-to-face individual therapy via video visit today.  The patient gave verbal consent for the session to be on video on WebEx.  The patient was in his house alone and the therapist was in her office.  The patient presents with pressured speech and tangential thoughts.  The patient reports that he has been put on Vraylar and he says that the medication is working.  He talked about his relationship and trying to communicate with his partner.  I asked patient if he thought he was hypomanic and he said that he thought he was.  He did not seem to want to do anything about it.  Encouraged him to continue to take his medications and will see him next week again to monitor.   ? ?Interventions: Cognitive Behavioral Therapy and Assertiveness/Communication ? ?Diagnosis:Chronic post-traumatic stress disorder ? ?Generalized anxiety disorder ? ?Bipolar I disorder (HCC) ? ?Borderline personality disorder (HCC) ? ?Plan: Please see Treatment plan in Therapy Charts with target date of 12/23/2021.  The  patient approved this plan.  Patient is maintaining at this point in time. ? ?Amariyana Heacox G Durene Dodge, LCSW ? ? ? ? ? ? ? ? ? ? ? ? ? ? ? ? ? ?Celise Bazar G Tiffani Kadow, LCSW ? ? ? ? ? ? ? ? ? ? ? ? ? ? ?Anu Stagner G Magdelene Ruark, LCSW ? ? ? ? ? ? ? ? ? ? ? ? ? ? ?Brynnlie Unterreiner G Garyson Stelly, LCSW ? ? ? ? ? ? ? ? ? ? ? ? ? ? ?Kile Kabler G Devinne Epstein, LCSW ? ? ? ? ? ? ? ? ? ? ? ? ? ? ?Braeden Kennan G Gwendlyn Hanback, LCSW ? ? ? ? ? ? ? ? ? ? ? ? ? ? ?Rexton Greulich G Tavian Callander, LCSW ? ? ? ? ? ? ? ? ? ? ? ? ? ? ?Graylen Noboa G Greene Diodato, LCSW ?

## 2021-10-08 ENCOUNTER — Telehealth (INDEPENDENT_AMBULATORY_CARE_PROVIDER_SITE_OTHER): Payer: Self-pay | Admitting: Adult Health

## 2021-10-08 DIAGNOSIS — Z91199 Patient's noncompliance with other medical treatment and regimen due to unspecified reason: Secondary | ICD-10-CM

## 2021-10-08 NOTE — Progress Notes (Signed)
Patient no show appointment. ? ?

## 2021-10-12 ENCOUNTER — Telehealth: Payer: Self-pay | Admitting: Adult Health

## 2021-10-12 NOTE — Telephone Encounter (Signed)
His prior authorization was resubmitted, pending response. Received a denial initially
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
## 2021-10-13 NOTE — Telephone Encounter (Signed)
Can you call and see how he is doing on the medication?

## 2021-10-13 NOTE — Telephone Encounter (Signed)
LVM to [facility] 

## 2021-10-13 NOTE — Telephone Encounter (Signed)
Resubmitted his prior authorization for VRAYLAR 1.5 MG and response was a DENIAL again, his insurance requires guidelines as listed below.   The member has a clinical condition or needs a specific dosage form for which there is no alternative on the formulary OR - The listed formulary alternatives are not recommended based on published guidelines or clinical literature OR - The formulary alternatives will likely be ineffective or less effective for the member OR - The formulary alternatives will likely cause an adverse effect OR - The member is unable to take the required number of formulary alternatives for the given diagnosis due to a trial and inadequate treatment response or contraindication OR - The member has tried and failed the required number of formulary alternatives. Formulary alternative(s) are aripiprazole, asenapine, clozapine, lurasidone, olanzapine, paliperidone extended-release, quetiapine, quetiapine extended-release, risperidone, ziprasidone. Requirement: 3 in a class with 3 or more alternatives, 2 in a class with 2 alternatives, or 1 in a class with only 1 alternative.   Pt has tried and failed seroquel, abilify, risperidone, and latuda.
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
## 2021-10-14 ENCOUNTER — Ambulatory Visit: Payer: 59 | Admitting: Psychology

## 2021-10-14 NOTE — Telephone Encounter (Signed)
Pt will be by to pick up samples and I also am giving him a 30 day voucher to take to his pharmacy.

## 2021-10-22 ENCOUNTER — Ambulatory Visit (INDEPENDENT_AMBULATORY_CARE_PROVIDER_SITE_OTHER): Payer: 59 | Admitting: Psychology

## 2021-10-22 DIAGNOSIS — F4312 Post-traumatic stress disorder, chronic: Secondary | ICD-10-CM

## 2021-10-22 DIAGNOSIS — F603 Borderline personality disorder: Secondary | ICD-10-CM

## 2021-10-22 DIAGNOSIS — F319 Bipolar disorder, unspecified: Secondary | ICD-10-CM | POA: Diagnosis not present

## 2021-10-22 NOTE — Progress Notes (Signed)
[facility] Counselor/Therapist Progress Note  Patient ID: [name] [name], MRN: [id number],    Date: 10/22/2021  Time Spent: 60 minutes  Treatment Type: Individual Therapy  Reported Symptoms: agitated,  pressured speech  Mental Status Exam: Appearance:  Casual     Behavior: Monopolizing  Motor: Restlestness  Speech/Language:  Pressured  Affect: Appropriate  Mood: normal  Thought process: tangential  Thought content:   WNL  Sensory/Perceptual disturbances:   WNL  Orientation: oriented to person, place, time/date, and situation  Attention: Fair  Concentration: Good  Memory: WNL  Fund of knowledge:  Fair  Insight:   Poor  Judgment:  Poor  Impulse Control: Poor   Risk Assessment: Danger to Self:  No Self-injurious Behavior: No Danger to Others: No Duty to Warn:no Physical Aggression / Violence:No  Access to Firearms a concern: No  Gang Involvement:No   Subjective: The patient attended a face-to-face individual therapy via video visit today.  The patient gave verbal consent for the session to be on video on WebEx.  The patient was in his house alone and the therapist was in her office.  The patient presents with pressured speech and tangential thoughts.  The patient continues to have some issues with mania.  As we talked today I found out that he is participating in some drug use with his partner, which could be causing some of his issues with mania he also reports that he ran out of his Arman Filter today and he is supposed to go pick up samples.  We talked about the need for him to go ahead and do that as he is supposed to start a new job on Tuesday.  The patient talked a lot about relationship issues with his partner today and he struggles with some of the things that his partner does.  Allowed him to vent and recommended that he follow up with his medication provider today.  Interventions: Cognitive Behavioral Therapy and Assertiveness/Communication  Diagnosis:Chronic  post-traumatic stress disorder  Bipolar I disorder (HCC)  Borderline personality disorder ([place])  Plan: Client Abilities/Strengths  Intelligent, ability for insight  Client Treatment Preferences  Outpatient individual therapy  Client Statement of Needs  "I need some help with my PTSD and Borderline"  Treatment Level  Outpatient Individual therapy  Symptoms  Experiences disturbances in sleep.: (Status: maintained). Experiences frequent  nightmares. (Status: maintained). Has been exposed to a traumatic event  involving actual or perceived threat of death or serious injury.: (Status:  maintained). Impairment in social, occupational, or other areas of functioning.:  (Status: maintained). Intentionally avoids thoughts, feelings, or discussions related to the traumatic  event.: (Status: maintained). Reports hypervigilance.:  (Status: maintained).  Problems Addressed  Borderline Personality Disorder, Borderline Personality Disorder, Posttraumatic Stress Disorder  (PTSD), Borderline Personality Disorder, Posttraumatic Stress Disorder (PTSD)   Goals 1. Develop and demonstrate anger management skills. 2. Develop and demonstrate coping skills to deal with mood swings. 3. Eliminate or reduce the negative impact trauma related symptoms have  on social, occupational, and family functioning. 4. Learn and practice interpersonal relationship skills. Objective Reduce the frequency of maladaptive behaviors, thoughts, and feelings that interfere with attaining a  reasonable quality of life. Target Date: 2021-12-23 Frequency: Weekly Progress: 0 Modality: individual Related Interventions 1. Use validation, dialectical strategies (e.g., metaphor, devil's advocate), and cognitive-behavioral strategies (e.g., cost-benefit analysis, cognitive restructuring, personal and interpersonal skills  training) to help the client manage, reduce, or regulate maladaptive behaviors (e.g., angry  outbursts, binge  drinking, abusive relationships, high-risk sex, uncontrolled spending),  thoughts (e.g., all-or-nothing thinking, catastrophizing, personalizing), and feelings (e.g., rage,  hopelessness, abandonment); see Cognitive-Behavioral Treatment of Borderline Personality  Disorder by Iantha Fallen). Objective Discuss previous or current posttraumatic stress. Target Date: 2021-12-23 Frequency: Weekly Progress: 0 Modality: individual 5. No longer avoids persons, places, activities, and objects that are  reminiscent of the traumatic event. Objective Participate in Eye Movement Desensitization and Reprocessing (EMDR) to reduce emotional distress  related to traumatic thoughts, feelings, and images.  Target Date: 2021-12-23 Frequency: Weekly Progress: 0 Modality: individual Related Interventions 1. Utilize Eye Movement Desensitization and Reprocessing (EMDR) to reduce the client's  emotional reactivity to the traumatic event and reduce PTSD symptoms. Diagnosis Axis  F60.3 (Borderline personality disorder) - Open - Borderline Personality  Disorder  Axis  F43.10 (Posttraumatic stress disorder) - Open - [Signifier: n/a] Posttraumatic Stress Disorder Conditions or discharge Achievement of treatment goals and objectives   The patient approved this plan.  Patient is maintaining at this point in time.  Maggi Hershkowitz G Nashid Pellum, LCSW                  Valetta Mulroy G Kehinde Totzke, LCSW               Cyrilla Durkin G Bessy Reaney, LCSW               Genia Perin G Alverda Nazzaro, LCSW               Leydi Winstead G Hensley Aziz, LCSW               Amunique Neyra G Tansy Lorek, LCSW               Carolyn Maniscalco G Anabia Weatherwax, LCSW               Shellee Streng G Nishka Heide, LCSW               Noha Karasik G Saran Laviolette, LCSW

## 2021-10-28 ENCOUNTER — Ambulatory Visit: Payer: 59 | Admitting: Psychology

## 2021-11-08 ENCOUNTER — Telehealth: Payer: Self-pay | Admitting: Adult Health

## 2021-11-08 NOTE — Telephone Encounter (Signed)
[name] [name] has been approved for pt assistance for her Vraylar through 10/28/22.  He should have already received his first shipment.

## 2021-11-08 NOTE — Telephone Encounter (Signed)
Pt informed

## 2021-11-11 ENCOUNTER — Ambulatory Visit (INDEPENDENT_AMBULATORY_CARE_PROVIDER_SITE_OTHER): Payer: 59 | Admitting: Psychology

## 2021-11-11 DIAGNOSIS — F4312 Post-traumatic stress disorder, chronic: Secondary | ICD-10-CM | POA: Diagnosis not present

## 2021-11-11 DIAGNOSIS — F603 Borderline personality disorder: Secondary | ICD-10-CM

## 2021-11-11 DIAGNOSIS — F319 Bipolar disorder, unspecified: Secondary | ICD-10-CM
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
## 2021-11-25 ENCOUNTER — Ambulatory Visit: Payer: 59 | Admitting: Psychology

## 2021-12-09 ENCOUNTER — Ambulatory Visit: Payer: 59 | Admitting: Psychology

## 2021-12-23 ENCOUNTER — Ambulatory Visit: Payer: 59 | Admitting: Psychology

## 2022-01-03 ENCOUNTER — Telehealth: Payer: Self-pay

## 2022-01-03 NOTE — Telephone Encounter (Signed)
Mom notified.
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
## 2022-01-03 NOTE — Telephone Encounter (Signed)
Noted. He can also go to the ED.

## 2022-01-06 ENCOUNTER — Ambulatory Visit: Payer: 59 | Admitting: Psychology

## 2022-01-20 ENCOUNTER — Ambulatory Visit: Payer: 59 | Admitting: Psychology

## 2022-02-03 ENCOUNTER — Ambulatory Visit: Payer: 59 | Admitting: Psychology

## 2022-02-17 ENCOUNTER — Ambulatory Visit: Payer: 59 | Admitting: Psychology

## 2022-03-03 ENCOUNTER — Ambulatory Visit: Payer: 59 | Admitting: Psychology

## 2022-03-17 ENCOUNTER — Ambulatory Visit: Payer: 59 | Admitting: Psychology

## 2022-03-31 ENCOUNTER — Ambulatory Visit: Payer: 59 | Admitting: Psychology

## 2022-04-05 ENCOUNTER — Ambulatory Visit (INDEPENDENT_AMBULATORY_CARE_PROVIDER_SITE_OTHER): Payer: Self-pay | Admitting: Adult Health

## 2022-04-05 DIAGNOSIS — F489 Nonpsychotic mental disorder, unspecified: Secondary | ICD-10-CM

## 2022-04-05 NOTE — Progress Notes (Signed)
Patient no show appointment. ? ?

## 2022-04-14 ENCOUNTER — Ambulatory Visit: Payer: 59 | Admitting: Psychology

## 2022-04-28 ENCOUNTER — Ambulatory Visit: Payer: 59 | Admitting: Psychology

## 2022-05-12 ENCOUNTER — Ambulatory Visit: Payer: 59 | Admitting: Psychology

## 2022-06-20 ENCOUNTER — Telehealth: Payer: 59 | Admitting: Adult Health

## 2022-08-04 ENCOUNTER — Encounter: Payer: Self-pay | Admitting: Adult Health

## 2022-08-04 ENCOUNTER — Ambulatory Visit (INDEPENDENT_AMBULATORY_CARE_PROVIDER_SITE_OTHER): Payer: Self-pay | Admitting: Adult Health

## 2022-08-04 ENCOUNTER — Telehealth: Payer: Self-pay | Admitting: Adult Health

## 2022-08-04 DIAGNOSIS — F5105 Insomnia due to other mental disorder: Secondary | ICD-10-CM

## 2022-08-04 DIAGNOSIS — F411 Generalized anxiety disorder: Secondary | ICD-10-CM

## 2022-08-04 DIAGNOSIS — F603 Borderline personality disorder: Secondary | ICD-10-CM

## 2022-08-04 DIAGNOSIS — F319 Bipolar disorder, unspecified: Secondary | ICD-10-CM

## 2022-08-04 NOTE — Telephone Encounter (Deleted)
Called mom and she and patient were screaming at each other with patient using lots of profanity.  I was unable to have a productive conversation with mom due to this and told her that I could not listen to this and hung up.

## 2022-08-04 NOTE — Telephone Encounter (Signed)
Noted. Ty.

## 2022-08-04 NOTE — Telephone Encounter (Signed)
Mom called and said that she would like  to talk to [name] [name]. [name] [name] would like nurse to call her and see what she wants. She is on the [facility]. Marland Kitchen Her name is [name] [name] please call [phone]

## 2022-08-04 NOTE — Progress Notes (Signed)
Wesam Kidney [id number] 1991-02-11 32 y.o.  Subjective:   Patient ID:  [name] [name] is a 32 y.o. (DOB 24-Jan-1991) male.  Chief Complaint: No chief complaint on file.   HPI [name] [name] presents to the office today for follow-up of PTSD, GAD, BPD 1, and insomia.   Mother unable to attend visit per patient request.  Describes mood today as "not too good". Pleasant. Denies tearfulness. Mood symptoms - reports depression, anxiety, and irritability. Reports outbursts. Reports worry, rumination, and over thinking. Reports mood fluctuations. Reports mania - 3 x a week. Stating "I feel like I'm doing better". Has started working with a therapist - [facility] and feels it will be helpful for him. Would like to continue medication, but reports a history of non-compliance with Vraylar and other medications. Is interested in taking a long acting injectable to help with compliance - and mood stability. Mother continues to be supportive. Varying interest and motivation. Taking medications as prescribed.  Energy levels fluctuate. Active, has a regular exercise routine. Enjoys some usual interests and activities. Lives with mother. Spending time with family and friends - "prefers to be alone. Appetite adequate. Weight gain - 174 to 177 pounds. Sleep has improved. Averages 6 to 8 hours on good days and 12 hours on bad days. Focus and concentration stable. Completing tasks. Managing aspects of household. Unemployed currently. Denies SI or HI.  Denies AH or VH. Denies self harm. Reports THC for relaxation. Therapist - Mason Jim - 3 weeks. Reports sobriety x 4 months.  Previous medication trials: Latuda, Trazadone  Review of Systems:  Review of Systems  Musculoskeletal:  Negative for gait problem.  Neurological:  Negative for tremors.  Psychiatric/Behavioral:         Please refer to HPI    Medications: I have reviewed the patient's current medications.  Current Outpatient Medications  Medication  Sig Dispense Refill   cariprazine (VRAYLAR) 1.5 MG capsule Take 1 capsule (1.5 mg total) by mouth daily. 30 capsule 2   methocarbamol (ROBAXIN) 500 MG tablet Take 1 tablet (500 mg total) by mouth 2 (two) times daily as needed for muscle spasms. 10 tablet 0   mirtazapine (REMERON) 15 MG tablet TAKE 1 TABLET BY MOUTH EVERYDAY AT BEDTIME 30 tablet 5   No current facility-administered medications for this visit.    Medication Side Effects: None  Allergies:  Allergies  Allergen Reactions   Bee Venom Hives    Past Medical History:  Diagnosis Date   Bipolar 1 disorder ([place])    PTSD (post-traumatic stress disorder)     Past Medical History, Surgical history, Social history, and Family history were reviewed and updated as appropriate.   Please see review of systems for further details on the patient's review from today.   Objective:   Physical Exam:  There were no vitals taken for this visit.  Physical Exam Constitutional:      General: He is not in acute distress. Musculoskeletal:        General: No deformity.  Neurological:     Mental Status: He is alert and oriented to person, place, and time.     Coordination: Coordination normal.  Psychiatric:        Attention and Perception: Attention and perception normal. He does not perceive auditory or visual hallucinations.        Mood and Affect: Mood normal. Mood is not anxious or depressed. Affect is not labile, blunt, angry or inappropriate.        Speech: Speech normal.  Behavior: Behavior normal.        Thought Content: Thought content normal. Thought content is not paranoid or delusional. Thought content does not include homicidal or suicidal ideation. Thought content does not include homicidal or suicidal plan.        Cognition and Memory: Cognition and memory normal.        Judgment: Judgment normal.     Comments: Insight intact     Lab Review:     Component Value Date/Time   NA 137 09/18/2014 1420   K 3.6  09/18/2014 1420   CL 103 09/18/2014 1420   CO2 22 09/18/2014 1420   GLUCOSE 100 (H) 09/18/2014 1420   BUN 9 09/18/2014 1420   CREATININE 0.86 09/18/2014 1420   CALCIUM 10.0 09/18/2014 1420   PROT 7.9 09/18/2014 1420   ALBUMIN 4.4 09/18/2014 1420   AST 31 09/18/2014 1420   ALT 36 09/18/2014 1420   ALKPHOS 50 09/18/2014 1420   BILITOT 0.5 09/18/2014 1420   GFRNONAA >90 09/18/2014 1420   GFRAA >90 09/18/2014 1420       Component Value Date/Time   WBC 6.2 09/18/2014 1420   RBC 4.85 09/18/2014 1420   HGB 15.6 09/18/2014 1420   HCT 42.4 09/18/2014 1420   PLT 218 09/18/2014 1420   MCV 87.4 09/18/2014 1420   MCH 32.2 09/18/2014 1420   MCHC 36.8 (H) 09/18/2014 1420   RDW 12.5 09/18/2014 1420    No results found for: "POCLITH", "LITHIUM"   Lab Results  Component Value Date   VALPROATE 58.5 07/01/2009     .res Assessment: Plan:    Plan:  PDMP reviewed  Continue: Vraylar 1.'5mg'$  daily Remeron '15mg'$  at hs   Will review injectable SGA's for mood stability and update patient. He is to continue current medication for now.   RTC 4 weeks  Spoke with mother about patient - voicing concerns about her son's state of mind and living situation.   Patient advised to contact office with any questions, adverse effects, or acute worsening in signs and symptoms.  Discussed potential metabolic side effects associated with atypical antipsychotics, as well as potential risk for movement side effects. Advised pt to contact office if movement side effects occur.   Diagnoses and all orders for this visit:  Bipolar I disorder ([place])  Borderline personality disorder ([place])  Generalized anxiety disorder  Insomnia due to mental condition     Please see After Visit Summary for patient specific instructions.  No future appointments.   No orders of the defined types were placed in this encounter.   -------------------------------

## 2022-08-04 NOTE — Telephone Encounter (Signed)
It has been discussed with pt by provider to possibly start an LAI, will discuss with Dr. Clovis Pu as well for a recommendation on which one then follow up with him.

## 2022-08-04 NOTE — Telephone Encounter (Signed)
In my limited experience the easiest LAI to use are Uzedi and invega sustenna but neither are approved for bipolar but are for schizoaffective.  Risperdal Lemar Lofty is FDA approved for bipolar and it might be easier to get.

## 2022-08-05 NOTE — Telephone Encounter (Signed)
Noted thank you

## 2022-08-08 ENCOUNTER — Telehealth: Payer: Self-pay

## 2022-08-08 NOTE — Telephone Encounter (Signed)
Noted  

## 2022-08-08 NOTE — Telephone Encounter (Signed)
After confirming with Dr. Clovis Pu he advises pt to start oral Risperdal 2 mg take one tablet daily for 7 days, then 1 mg tablet daily for 7 days, then stop. Pt will also receive the LAI- Risperdal Consta 25 mg every 14 days. Contacted pt's Mom about pt's LAI- but she was at [facility] and couldn't talk long. Gave her my contact information. I will need to verify which pharmacy they want to use as well.

## 2022-08-10 NOTE — Telephone Encounter (Signed)
I contacted Mom again to go over the [place], but then our phone call disconnected. I will call back shortly in case her cell phone was in a bad location.

## 2022-08-10 NOTE — Telephone Encounter (Signed)
Mom called back but had an apt so she couldn't talk again. She did say she is free tomorrow to discuss the medication. She apologized for all the issues and unable to talk to me about his next step with medication.   Briefly Mom mentioned pt was wanting every 30 days so Idk that pt could keep a consistent treatment every 14 days with Risperdal Consta.

## 2022-08-10 NOTE — Telephone Encounter (Signed)
We can discuss with patient. This is still a better option for compliance.

## 2022-08-11 ENCOUNTER — Telehealth: Payer: Self-pay

## 2022-08-11 NOTE — Telephone Encounter (Signed)
Contacted Mom and pt was also there at end of discussion of his medications. He does agree to the every 14 day injection of Risperdal Consta, and explained the 2 weeks of oral Risperidone 2 mg taper. Pt prefers [facility] will order medications and see if needs a PA. Informed both of them that it should be next week. Pt is aware he will continue Mirtazapine at hs even with his injection to help with his sleep and also is an antidepressant.   Pt is agreeable. Pt has been seeing a Social worker, Mason Jim at [facility] for [place] since 07/15/2022 weekly, Mom reports he is doing well with her. She is also the one that recommended an LAI.

## 2022-08-11 NOTE — Telephone Encounter (Signed)
Noted. Ty.

## 2022-08-11 NOTE — Telephone Encounter (Signed)
Noted thanks I will try to reach her.

## 2022-08-11 NOTE — Telephone Encounter (Signed)
Mom, [name] [name], called at 1:16 wanting to be sure that she can talk with you since the phone dropped yesterday and she unable to talk.  She apologized for the inability to talk with you.  She is free today. Please try again.

## 2022-08-15 ENCOUNTER — Other Ambulatory Visit: Payer: Self-pay

## 2022-08-15 DIAGNOSIS — F319 Bipolar disorder, unspecified: Secondary | ICD-10-CM

## 2022-08-15 MED ORDER — RISPERIDONE MICROSPHERES ER 25 MG IM SRER: 25.0000 mg | INTRAMUSCULAR | 4 refills | Status: DC

## 2022-08-15 MED ORDER — RISPERIDONE 2 MG PO TABS: ORAL_TABLET | ORAL | 0 refills | Status: DC

## 2022-08-17 NOTE — Telephone Encounter (Signed)
Contacted pharmacy about pt's LAI of Risperdal Consta and it is ready for pick up with no charge, no PA needed. Pt's oral Risperidone is also ready. Spoke with Mom this morning and pt tentatively will come tomorrow 08/18/2022 at 2:00 pm, unless after she speaks with pt and he would rather come today. Pt is not awake yet so she will call back if something changes.  Also given instructions on taking his oral Risperidone for 2 weeks.

## 2022-08-17 NOTE — Telephone Encounter (Signed)
Noted. Ty.

## 2022-08-18 ENCOUNTER — Ambulatory Visit (INDEPENDENT_AMBULATORY_CARE_PROVIDER_SITE_OTHER): Payer: BC Managed Care – PPO

## 2022-08-18 DIAGNOSIS — F3131 Bipolar disorder, current episode depressed, mild: Secondary | ICD-10-CM | POA: Diagnosis not present

## 2022-08-18 DIAGNOSIS — F319 Bipolar disorder, unspecified: Secondary | ICD-10-CM

## 2022-08-22 ENCOUNTER — Telehealth: Payer: Self-pay

## 2022-08-22 NOTE — Telephone Encounter (Signed)
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  Rx Cogentin 1 mg BID and 1 mg prn muscle rigidity.   Please ask [name] [name] to consider the risks/benefits of continuing the po risperidone with the Cogentin given his mania vs stopping the PO and giving the injection Risperdal Consta an opportuity to manage his mania.  It is not clear to me how long he has been on each of the types of risperidone but I would be happy to discuss it with his primary provider, [name] [name].

## 2022-08-22 NOTE — Telephone Encounter (Signed)
Noted  

## 2022-08-22 NOTE — Telephone Encounter (Signed)
Received a voicemail yesterday, March 31 st that pt was having an interaction with his oral Risperdal and asked for me to contact him Monday morning.  Called his Mom, Bethena Roys this morning and she reports after pt received injectable from me on Thursday, March 28 th, that night he started 2 mg Risperdal. He reports feeling nauseated but that was the only symptom that night. The next night was Friday, March 29 th and he reports feeling nauseated and he vomited. On Saturday night, March 30 th, he started having more severe side effects, Mom reports he came and got her and his head was leaning over his right shoulder very rigid. She could move it back but would go back to that position. He also was holding his thumb in his mouth due to his tongue being distorted. He asked his Mom if he could have one of her Clonazepam to see if it would help relax his facial distortions. He took a dose and it did relax him. He finally went to sleep Sunday morning at 4 am and slept till 2 pm. When he woke up yesterday afternoon his symptoms were all gone. He is still in bed asleep this morning. He did not have any oral Risperdal 2 mg on Sunday night. He has had a total of 3 doses of oral and the injection of Risperdal Consta 25 mg.  Mom, reports he admitted to using mariguana to his counselor on Friday but she doesn't know how often he is smoking it. He was pretty shaken up by his symptoms Saturday night is why he admitted to her.  When pt came in on Thursday afternoon he was very manic, with rapid speech, constant talking. I could hardly get a word in to explain the injection and oral tablet.   Informed Mom I would discuss with Rollene Fare and Dr. Clovis Pu and get back with her.

## 2022-08-23 ENCOUNTER — Other Ambulatory Visit: Payer: Self-pay | Admitting: Adult Health

## 2022-08-23 NOTE — Telephone Encounter (Signed)
Rtc to pt but he was not home, spoke with Winnifred Friar and she reports pt is not having anymore side effects at this time. He has not had any oral Risperdal since Saturday night. Informed her I would call back later to discuss more with pt. We will plan on giving next injection on the 12 th.

## 2022-08-30 ENCOUNTER — Telehealth: Payer: Self-pay | Admitting: Psychiatry

## 2022-08-30 NOTE — Telephone Encounter (Signed)
My understanding is that [name] [name]'s care is now under Dr. Jennelle Human because he has started the injections of risperidone.  Also that he is no longer taking the Vraylar.  He has been on pt. Assistance for [employer].  It ends 10/2022.  I will not send him a new application since he is no longer taking this medication.  If he is started back on the [place], he will need to reapply for assistance.

## 2022-08-31 NOTE — Telephone Encounter (Signed)
Pt will still remain under Regina's care but the Vraylar is discontinued.

## 2022-08-31 NOTE — Telephone Encounter (Signed)
Please see message from Marilyn.  

## 2022-09-08 ENCOUNTER — Telehealth: Payer: Self-pay

## 2022-09-08 NOTE — Telephone Encounter (Signed)
Noted,Ty

## 2022-09-08 NOTE — Telephone Encounter (Signed)
Spoke with Mom, Darel Hong about pt's Risperdal Consta, pt was due last Friday, April 12th but they were gone due to furniture market. Pt is able to come tomorrow, April 19th, Mom has already picked up the injection for him to get. Discussed how his mood and behavior has been, she reports it's been good and there hasn't been anything inappropriate. Due to him being a week late she reports he's talking louder and a bit hyper, but denies any mania. Will plan on giving the 2nd shot tomorrow. We already set up a schedule for pt to come every other Friday after his counseling at [facility].Marland Kitchen

## 2022-09-09 ENCOUNTER — Ambulatory Visit (INDEPENDENT_AMBULATORY_CARE_PROVIDER_SITE_OTHER): Payer: BC Managed Care – PPO

## 2022-09-09 DIAGNOSIS — F3131 Bipolar disorder, current episode depressed, mild: Secondary | ICD-10-CM | POA: Diagnosis not present

## 2022-09-09 DIAGNOSIS — F319 Bipolar disorder, unspecified: Secondary | ICD-10-CM

## 2022-09-11 NOTE — Progress Notes (Signed)
NURSES NOTE:  Pt. arrived for his 1st LAI of Risperidone XR 25 mg. [facility] He received 25 mg in his left upper gluteal, pt reported no issues when receiving injection. Pt was very hyperverbal during visit, I tried explaining how he was to take his oral Risperidone for a couple of weeks but not sure he fully understood. I had already previously confirmed with his Mom on dosing and she verbalized understanding. Pt instructed to come back in 14 days, he mentioned April 12 th would be the best day for him, after his counseling apt.  Pt advised to contact office if having any issues or concerns, he agreed.  LOT [id number] EXP JUNE 2025

## 2022-09-12 NOTE — Progress Notes (Signed)
  NURSES NOTE:  Patient arrived for his 2nd LAI, pt was due on the 12th, April but his Mom reports they were out of their house for furniture market and were not able to get to office for injection. Pt brought injection with him, Risperidone XR suspension 25 mg, he received in his Rt upper gluteal area. Pt tolerated well, verbalized no complaints. Pt did feel like it's been helpful and he could tell it was wearing off. Next injection should be on May 3rd.         LOT [id number] EXP JUNE 2025

## 2022-09-23 ENCOUNTER — Ambulatory Visit: Payer: BC Managed Care – PPO

## 2022-09-26 ENCOUNTER — Ambulatory Visit (INDEPENDENT_AMBULATORY_CARE_PROVIDER_SITE_OTHER): Payer: BC Managed Care – PPO

## 2022-09-26 DIAGNOSIS — F319 Bipolar disorder, unspecified: Secondary | ICD-10-CM | POA: Diagnosis not present

## 2022-09-27 NOTE — Progress Notes (Signed)
NURSES NOTE:   Patient arrived for his 3rd LAI, pt was due on the 3rd of May but the pharmacy had to order his medication and it came in today. Pt brought injection with him, Risperidone XR suspension 25 mg, he received in his Rt upper gluteal area. Pt tolerated well, verbalized no complaints. Pt's appearance was very much improved since last injection, his talking was improved, decrease in tone and rapidness some. He was very restless in the room and pacing as I was mixing the injection together. He reports feeling restless and pacing for about a week. Informed pt if that got worse to let me know but I would check with Dr. Jennelle Human and Yvette Rack, NP to get their thoughts. Pt is scheduled back in 2 weeks, on a Friday, May 17th.  After discussing with Dr. Jennelle Human about pt's pacing he suggested Clonidine prn for restlessness. Will contact pt to see if he would like to try taking that to see if it would help.   EXP 2025-06 LOT [id number]

## 2022-10-07 ENCOUNTER — Ambulatory Visit: Payer: BC Managed Care – PPO

## 2022-10-09 ENCOUNTER — Other Ambulatory Visit: Payer: Self-pay | Admitting: Adult Health

## 2022-10-09 DIAGNOSIS — F4312 Post-traumatic stress disorder, chronic: Secondary | ICD-10-CM

## 2022-10-13 ENCOUNTER — Telehealth: Payer: Self-pay

## 2022-10-13 NOTE — Telephone Encounter (Signed)
Noted. Ty!

## 2022-10-13 NOTE — Telephone Encounter (Signed)
Contacted pt due to him missing his injection on 10/07/2022. Pt just reports he's had a crazy week and couldn't make it but can tell the medication is wearing off. Pt did not call to cancel apt last week. York Spaniel he could come tomorrow, Friday May 24th after 3 pm, informed him it was the start of a holiday weekend and I wasn't sure how much staff  would be in the office for him to get. I also had contacted his Mom earlier today before this call and she reports he told her he did come and get the injection. He is not aware I spoke with his Mother, As well as last Friday, [facility] had a Mental Health Awareness table set up Friday for the public which I was assisting with and I specifically returned back to the office for him to be able to get his injection.   Informed pt I would contact him back, I had also told Mom I would call her back as well.

## 2022-10-14 NOTE — Telephone Encounter (Signed)
Contacted the patient and emphasized the importance of him keeping his apt for his injections every 2 weeks/14 days. He reports the medication helps but if he's not consistent it won't benefit him. He agreed and apologized several times. Informed him he would have to come in next week to get his injection. He is already scheduled on May 31st because those were scheduled out every 2 weeks. He may come sooner next week, I am not sure he had to get off the phone quickly. He was instructed to call back to let the office know.

## 2022-10-14 NOTE — Telephone Encounter (Signed)
Noted. Ty!

## 2022-10-18 ENCOUNTER — Ambulatory Visit (INDEPENDENT_AMBULATORY_CARE_PROVIDER_SITE_OTHER): Payer: BC Managed Care – PPO

## 2022-10-18 DIAGNOSIS — F3131 Bipolar disorder, current episode depressed, mild: Secondary | ICD-10-CM | POA: Diagnosis not present

## 2022-10-18 DIAGNOSIS — F319 Bipolar disorder, unspecified: Secondary | ICD-10-CM

## 2022-10-21 ENCOUNTER — Ambulatory Visit: Payer: BC Managed Care – PPO

## 2022-10-21 NOTE — Progress Notes (Signed)
NURSES NOTE:  Pt was due on 10/07/2022 for his injection but he did not show up or contact the office again. Nurse reached out to him on 10/13/2022 when I still hadn't heard from pt. Pt's Mom was told by pt that he did receive his injection but I informed her he did not. I contacted pt directly who stated he had a lot going on and couldn't get to the office. Informed him how important keeping his injection apts when due was vital for his stability. He verbalized understanding. He was unable to get to the office until 10/18/2022.   Patient arrived for his LAI, pt was due as mentioned above so he is late again. Pt brought injection with him but nurse had issue mixing it together, pt did not say whether or not he had left it out or what was wrong with the consistency once mixed, Risperidone XR suspension 25 mg, he received in his Rt upper gluteal area. Pt tolerated, verbalized no complaints. Pt's appearance was very much improved since last injection, his talking was improved, decrease in tone and rapidness some. Pt was not pacing like last visit but he was also late getting his next injection. Pt has not been compliant with coming to his apts as scheduled.   Discussed with Dr. Jennelle Human how pt is not compliant to getting his injections every 14 days as discussed and advised. Due to non-compliance pt can be referred to [facility], that is where he also receives counseling. Will discuss with Rene Kocher as well to make a decision.        EXP 2025-05 LOT [id number]

## 2022-11-05 ENCOUNTER — Other Ambulatory Visit: Payer: Self-pay | Admitting: Adult Health

## 2022-11-05 DIAGNOSIS — F4312 Post-traumatic stress disorder, chronic: Secondary | ICD-10-CM

## 2022-11-17 ENCOUNTER — Telehealth: Payer: Self-pay | Admitting: Adult Health

## 2022-11-17 NOTE — Telephone Encounter (Signed)
[name] [name],mom, called a couple of time today to see if [name] [name] could back in for his injections.  Getting them elsewhere has been unsuccessful.  He is 4 weeks behind now.  He has some medication but it has not been refrigerated so he needs to get a refill.  A refill [facility] to [facility] to  [id number] [place],  - [address]    He will pick it up and bring it with him.  They didn't know it was supposed to be refrigerated. But will be sure it is from now on.  Please let him know when he can come in for an appt.

## 2022-11-17 NOTE — Telephone Encounter (Signed)
Please see message.  Per earlier conversation there was an insurance issue.

## 2022-11-22 ENCOUNTER — Other Ambulatory Visit: Payer: Self-pay

## 2022-11-22 ENCOUNTER — Other Ambulatory Visit: Payer: Self-pay | Admitting: Adult Health

## 2022-11-22 DIAGNOSIS — F319 Bipolar disorder, unspecified: Secondary | ICD-10-CM

## 2022-11-22 MED ORDER — RISPERIDONE MICROSPHERES ER 25 MG IM SRER: 25.0000 mg | INTRAMUSCULAR | 4 refills | Status: DC

## 2022-11-22 NOTE — Telephone Encounter (Signed)
Pt will pick up injection on 07/03 and bring to office to be administered at 1 :00 pm

## 2022-11-23 NOTE — Telephone Encounter (Signed)
Pt did not call or reschedule his apt. Front office contacted Mom this afternoon and she reports pt didn't answer her calls so she had to send police to do a welfare check. Pt was home and reports one of his puppies died yesterday and he didn't feel like coming to get injection today. Mom was very frustrated and embarrassed by the situation, she did pick up his injection at the pharmacy and its stored in her refrigerator.   Almira Coaster is aware of his continued non-compliance and not showing up when scheduled.

## 2022-11-28 MED ORDER — RISPERIDONE MICROSPHERES ER 25 MG IM SRER
25.0000 mg | INTRAMUSCULAR | Status: DC
  Administered 2022-09-09: 25 mg via INTRAMUSCULAR

## 2022-11-28 MED ORDER — RISPERIDONE MICROSPHERES ER 25 MG IM SRER
25.0000 mg | INTRAMUSCULAR | Status: AC
  Administered 2022-09-26 – 2023-04-19 (×12): 25 mg via INTRAMUSCULAR

## 2022-11-28 NOTE — Addendum Note (Signed)
Addended by: Lambert Keto on: 11/28/2022 10:13 AM   Modules accepted: Orders

## 2022-11-28 NOTE — Addendum Note (Signed)
Addended by: Lambert Keto on: 11/28/2022 10:31 AM   Modules accepted: Orders

## 2022-11-28 NOTE — Addendum Note (Signed)
Addended by: Lambert Keto on: 11/28/2022 10:26 AM   Modules accepted: Orders

## 2022-11-29 NOTE — Telephone Encounter (Signed)
Working on referral to ACT TEAM-pt lives in [place] according to chart should be [facility]. Pt is non-complaint with follow up and maintaining schedule for injection of Risperdal Consta. Needs further assistance and care to keep med compliant.

## 2022-12-07 ENCOUNTER — Other Ambulatory Visit: Payer: Self-pay

## 2022-12-07 DIAGNOSIS — F319 Bipolar disorder, unspecified: Secondary | ICD-10-CM

## 2022-12-07 MED ORDER — RISPERIDONE MICROSPHERES ER 25 MG IM SRER
25.0000 mg | INTRAMUSCULAR | 0 refills | Status: DC
Start: 2022-12-07 — End: 2023-01-24

## 2022-12-12 ENCOUNTER — Ambulatory Visit (INDEPENDENT_AMBULATORY_CARE_PROVIDER_SITE_OTHER): Payer: Medicaid Other

## 2022-12-12 DIAGNOSIS — F319 Bipolar disorder, unspecified: Secondary | ICD-10-CM | POA: Diagnosis not present

## 2022-12-12 MED ORDER — RISPERIDONE MICROSPHERES ER 25 MG IM SRER
25.0000 mg | Freq: Once | INTRAMUSCULAR | Status: DC
Start: 2022-12-12 — End: 2022-12-12

## 2022-12-12 NOTE — Progress Notes (Signed)
NURSES NOTE:   Pt was discharged from hospital on 11/28/2022 and received his last injection of Risperdal Consta 25 mg IM prior to his release. Patient arrived for his LAI, pt showed up this morning but forgot to bring his injectable that he had previously picked up from the pharmacy. Pt then reports he will come back later this afternoon. Pt arrived 5 hours after scheduled time with his injectable and arrived with a friend. Pt was nonstop talking to nurse while drawing up his injection. Pt given Risperidone XR suspension 25 mg, he received in his Rt outer upper gluteal area. Pt tolerated, verbalized no complaints. Pt has not been compliant with coming to his apts as scheduled.    Discussed with Rene Kocher and Dr. Jennelle Human how pt is not compliant to getting his injections every 14 days as discussed and advised. Due to non-compliance pt can be referred to the ACTT team in [facility]. Contacted their office to get a referral form to send back with pt's information.         EXP 2025-05 LOT [id number]

## 2022-12-19 NOTE — Telephone Encounter (Signed)
Referral Form faxed to [facility] [phone] f., [phone] p.

## 2022-12-21 ENCOUNTER — Telehealth: Payer: Self-pay

## 2022-12-21 NOTE — Telephone Encounter (Signed)
Pt will be coming on Monday August 5 th for his injection. Rx sent to [facility] but they need a copy of his insurance/Medicaid cards. Will get that faxed over.

## 2022-12-26 ENCOUNTER — Telehealth: Payer: Self-pay

## 2022-12-26 ENCOUNTER — Ambulatory Visit: Payer: Medicaid Other

## 2022-12-27 ENCOUNTER — Ambulatory Visit: Payer: Medicaid Other

## 2022-12-27 NOTE — Telephone Encounter (Signed)
Prior Authorization initiated and submitted for Risperdal Consta  25 mg with [facility] but they responded with No PA required and it's on the formulary. If pharmacy has trouble they can override at point of sale using (SCC) 02 clarification code.  I contacted [employer] and they report it still won't go through, they clarified what insurance pt has. They do not have the correct insurance information and I can not give it to them since I have no copy of his Medicaid/Wellcare card just the ID#. I had asked his Mom to call last week to give the information to them so it wouldn't be an issue when his injection was due.   Mom/pt will have to be notified and until I have access to the medication pt is not able to get injection at this office.

## 2022-12-27 NOTE — Telephone Encounter (Signed)
Pt's insurance will cover generic and [employer] ordered the LAI and it will be here on Wednesday for pt to come to office in the pm for his injection.

## 2022-12-27 NOTE — Telephone Encounter (Signed)
Contacted [employer] and they report pt's LAI is not covered under his [facility], a PA is being submitted to see if this will get an approval.

## 2022-12-28 ENCOUNTER — Ambulatory Visit (INDEPENDENT_AMBULATORY_CARE_PROVIDER_SITE_OTHER): Payer: Medicaid Other

## 2022-12-28 DIAGNOSIS — F319 Bipolar disorder, unspecified: Secondary | ICD-10-CM

## 2022-12-28 DIAGNOSIS — F3131 Bipolar disorder, current episode depressed, mild: Secondary | ICD-10-CM | POA: Diagnosis not present

## 2022-12-28 NOTE — Progress Notes (Signed)
NURSES NOTE:   Pt's last injection was on 12/12/2022. His medication was ordered through [employer] this time and we had a little delay due to insurance and getting medication ordered. 2 single vial packs of Risperidone 25mg  received, one given today then in 2 weeks will give the next one. Pt is scheduled on 01/11/2023. Patient arrived for his LAI, Pt given Risperidone XR suspension 25 mg, he received in his Lt outer upper gluteal area. Pt tolerated, verbalized no complaints. Pt was quiet today, talking in normal tone, pleasant and engaging.  Pt given a letter today due to his bill amount and non compliance with medications. We will continue care for the next 3 months until he is able to get in with the ACTT team.          EXP 2025-05 LOT [id number]

## 2023-01-06 NOTE — Telephone Encounter (Signed)
FYI, I emailed a link to a [employer] article called "the Empty Adderall Factory".  Provides the most complete explanation with new info about the stimulant shortage.

## 2023-01-11 ENCOUNTER — Ambulatory Visit: Payer: Medicaid Other

## 2023-01-11 DIAGNOSIS — F319 Bipolar disorder, unspecified: Secondary | ICD-10-CM

## 2023-01-12 NOTE — Progress Notes (Signed)
NURSES NOTE:   Pt's last injection was on 12/28/2022. His medication was already at the office and it was taken out of the refrigerator 30 minutes prior to administration to get to room temperature. After mixing and shaking vial, drew up 2 ml of Risperidone injection, given in Left upper gluteal area.  Patient tolerated injection well. Pt was pleasant, talking, engaging.    Pt scheduled in 2 weeks, Sept. 4th.    EXP 2025-05 LOT [id number] NDC [phone]

## 2023-01-24 ENCOUNTER — Other Ambulatory Visit: Payer: Self-pay | Admitting: Adult Health

## 2023-01-24 DIAGNOSIS — F319 Bipolar disorder, unspecified: Secondary | ICD-10-CM

## 2023-01-25 ENCOUNTER — Ambulatory Visit: Payer: MEDICAID

## 2023-01-26 ENCOUNTER — Ambulatory Visit: Payer: MEDICAID

## 2023-01-26 DIAGNOSIS — F319 Bipolar disorder, unspecified: Secondary | ICD-10-CM | POA: Diagnosis not present

## 2023-01-30 ENCOUNTER — Telehealth: Payer: Self-pay

## 2023-01-30 NOTE — Telephone Encounter (Signed)
I have tried to reach [facility] a few times to make sure they received all the referral information and I did call again this morning and spoke with receptionist, she said the person over his area was in a meeting but would call back.

## 2023-01-30 NOTE — Telephone Encounter (Signed)
Pt did receive a letter dated 12/26/2022 from practice stating he needed to refer to another provider or contact the ACTT Team due to his noncompliance of treatment including his LAI as well as a high balance he owes. I have continued his injections. I was advised to until his care was taken over.   Received call from Canary Brim on 01/26/2023 the day of his injection, pt was scheduled on 01/25/23 but called after his scheduled time to say he was having car issues.  Mom reports pt having paranoia, crying spells, irritability, sleeping a lot and he has not gotten in with his counselor for 2 months due to change in his insurance. Pt is scheduled end of September with mental health counselor.   When pt arrived he was irritated with his Mom about something and irritated with his life in general. Pt did receive his LAI this day as well. His injection is every 14 days of Risperdal Consta 25 mg. He has been consistent was his last 3 injections as directed every 14 days.    Mom is asking what can be done or changed. Not sure how to proceed with this.

## 2023-01-30 NOTE — Telephone Encounter (Signed)
If he has been referred somewhere else that our obligation is to provide at least emergency care between now and then so as not to abandon the patient.  He needs to be given a deadline for scheduling another appointment with someone else.  As long as he is given a couple of months to make an appointment then that can be the deadline for any prescriptions from the office.  I have not seen the letter referred to.

## 2023-01-31 NOTE — Progress Notes (Signed)
NURSES NOTE:   Pt's last injection was on 01/11/2023. Pt had to reschedule this week due to having car issues. His medication was already at the office and it was taken out of the refrigerator 30 minutes prior to administration to get to room temperature. After mixing and shaking vial, drew up 2 ml of Risperidone injection, given in Left upper gluteal area.  Patient tolerated injection well. Pt was irritated with his Mom about something, not as pleasant and as happy as previously when coming for his LAI. Pt's Mom did call prior to his arrival with reporting some issues pt is having. See telephone note sent to [facility] and Dr. Jennelle Human.    Pt will be due for next injection in 14 days, around 02/09/2023.  EXP 2025-05 LOT [id number] NDC [phone]

## 2023-02-06 ENCOUNTER — Other Ambulatory Visit: Payer: Self-pay

## 2023-02-06 NOTE — Telephone Encounter (Signed)
Mom, Vivi Martens today at 10:30 stating that [facility] does have an intake appt only for Thursday morning at another office.  They will not be giving him his injection for that date.  Lute indicated he did have an appt here on 9/16 for his injection and she wanted to confirm that appt time.  I don't see the appt. So please call to confirm that he will be getting his injection here on Thursday.  She did say that then he will able to get his injection going forward from the new office. Also she had spoke about other symptom, paranoia, when here last and that you were going to review that with Dr. Jennelle Human to if other medications may be needed.  Darel Hong is wanting to make a follow up appt with Dr. Jennelle Human or Almira Coaster to discuss medications.  They do not yet have another Dr. For medication management as feels his medications need to be reviewed.  Are we providing Kaylin with this care over the next couple of months as well as the injections?  If so, we need to set an appt. For Molly Maduro to see Almira Coaster. Please let Darel Hong know about the appts at lease for the injection this Thursday.  Perhaps when he is here for the injections the othter can be addressed.

## 2023-02-08 ENCOUNTER — Ambulatory Visit: Payer: MEDICAID

## 2023-02-08 DIAGNOSIS — F319 Bipolar disorder, unspecified: Secondary | ICD-10-CM | POA: Diagnosis not present

## 2023-02-10 ENCOUNTER — Other Ambulatory Visit: Payer: Self-pay

## 2023-02-10 DIAGNOSIS — F319 Bipolar disorder, unspecified: Secondary | ICD-10-CM

## 2023-02-10 MED ORDER — RISPERIDONE MICROSPHERES ER 25 MG IM SRER: 25.0000 mg | INTRAMUSCULAR | 0 refills | Status: DC

## 2023-02-10 NOTE — Progress Notes (Signed)
NURSES NOTE:   Pt's last injection was on 01/26/2023. His medication is delivered by [facility] and taken out of the refrigerator 30 minutes prior to administration to get to room temperature. After mixing and shaking vial, drew up 2 ml of Risperidone injection, given in Left upper gluteal area.  Patient tolerated injection well.  Pt was irritated with his life and situation, he reports hitting his forehead into the wall a few days ago due to anger. Pt did have a swollen contusion on his head. Pt reports he is suppose to be meeting with mental health to be assessed and getting established with a new counselor. Pt reports being worried about that. Pt reports his friend he was living with took out a 50B on him but pt reports he didn't do anything wrong. Pt is living with his Mom currently but they are arguing and he is worried he won't have a place to live. Let pt express his feelings and concerns and informed him we could touch base at end of the week. He agreed.     Pt will be due for next injection in 14 days, around 02/21/2023.   EXP 2025-05 LOT [id number] NDC [phone]

## 2023-02-17 ENCOUNTER — Other Ambulatory Visit: Payer: Self-pay

## 2023-02-17 DIAGNOSIS — F319 Bipolar disorder, unspecified: Secondary | ICD-10-CM

## 2023-02-17 MED ORDER — RISPERIDONE MICROSPHERES ER 25 MG IM SRER: 25.0000 mg | INTRAMUSCULAR | 1 refills | Status: DC

## 2023-02-21 ENCOUNTER — Ambulatory Visit (INDEPENDENT_AMBULATORY_CARE_PROVIDER_SITE_OTHER): Payer: MEDICAID

## 2023-02-21 DIAGNOSIS — F319 Bipolar disorder, unspecified: Secondary | ICD-10-CM | POA: Diagnosis not present

## 2023-02-21 NOTE — Progress Notes (Signed)
NURSES NOTE:   Pt's last injection was on 02/08/2023. His medication is delivered by [facility] and taken out of the refrigerator 30 minutes prior to administration to get to room temperature. After mixing and shaking vial, drew up 2 ml of Risperidone injection, given in Right upper gluteal area.  Patient tolerated injection well.  Pt was very pleasant and in a good mood, little restless. He reports talking to a new girl for the past month and they did meet each other in person. He referred to her as his "girlfriend" but he did seem happy about that situation.    Pt will be due for next injection in 14 days, around 03/07/2023.   EXP 2025-05 LOT [id number] NDC [phone]

## 2023-03-06 ENCOUNTER — Other Ambulatory Visit: Payer: Self-pay

## 2023-03-06 DIAGNOSIS — F319 Bipolar disorder, unspecified: Secondary | ICD-10-CM

## 2023-03-06 MED ORDER — RISPERIDONE MICROSPHERES ER 25 MG IM SRER: 25.0000 mg | INTRAMUSCULAR | 1 refills | Status: DC

## 2023-03-07 ENCOUNTER — Ambulatory Visit: Payer: MEDICAID

## 2023-03-08 ENCOUNTER — Ambulatory Visit: Payer: MEDICAID

## 2023-03-08 DIAGNOSIS — F319 Bipolar disorder, unspecified: Secondary | ICD-10-CM

## 2023-03-21 NOTE — Progress Notes (Signed)
NURSES NOTE:   Pt's last injection was on 02/21/2023. His medication is delivered by [facility] and taken out of the refrigerator 30 minutes prior to administration to get to room temperature. After mixing and shaking vial, drew up 2 ml of Risperidone injection, given in Right upper gluteal area.  Patient tolerated injection well. Pt very irritable today.      Pt will be due for next injection in 14 days, around 03/22/2023.   EXP 2025-06 LOT [id number]  NDC [phone]

## 2023-03-22 ENCOUNTER — Ambulatory Visit: Payer: MEDICAID

## 2023-03-22 DIAGNOSIS — F319 Bipolar disorder, unspecified: Secondary | ICD-10-CM | POA: Diagnosis not present

## 2023-03-22 NOTE — Progress Notes (Signed)
NURSES NOTE:   Pt arrived for his LAI of Risperidone XR 25 mg take IM every 14 days.  Pt's last injection was on 03/08/2023. His medication is delivered by [facility] and taken out of the refrigerator 30 minutes prior to administration to get to room temperature. After mixing and shaking vial, drew up 2 ml of Risperidone injection, given in Right upper gluteal area.  Patient tolerated injection well. Pt was very pleasant and in much better spirits then 2 weeks ago, he has a job now with the school system as a Copy. Pt is excited about having a job and doesn't want to mess anything up. Pt also mentioned having a girlfriend at this time.    Pt will be due for next injection in 14 days, around 04/05/2023.   EXP 2025-06 LOT [id number] R  NDC [phone]

## 2023-03-28 ENCOUNTER — Other Ambulatory Visit: Payer: Self-pay

## 2023-03-28 DIAGNOSIS — F319 Bipolar disorder, unspecified: Secondary | ICD-10-CM

## 2023-03-28 MED ORDER — RISPERIDONE MICROSPHERES ER 25 MG IM SRER
25.0000 mg | INTRAMUSCULAR | 1 refills | Status: AC
Start: 2023-03-28 — End: ?

## 2023-04-07 ENCOUNTER — Ambulatory Visit: Payer: MEDICAID

## 2023-04-07 DIAGNOSIS — F319 Bipolar disorder, unspecified: Secondary | ICD-10-CM

## 2023-04-16 NOTE — Progress Notes (Signed)
NURSES NOTE:   Pt arrived for his LAI of Risperidone XR 25 mg take IM every 14 days.  Pt's last injection was on 04/06/2023. His medication is delivered by [facility] and taken out of the refrigerator 30 minutes prior to administration to get to room temperature. After mixing and shaking vial, drew up 2 ml of Risperidone injection, given in Right upper gluteal area.  Patient tolerated injection well. Pt was pleasant. Pt reports he is continuing to work, he also has a girlfriend he mentioned a couple weeks ago. Pt reports still sleeping on "friends" couch due to his Mom not letting him come back. Pt reports feeling very tired because he's not getting the rest he needs. Pt aware to come back in 2 weeks.    Pt will be due for next injection in 14 days, around 04/19/2023.   EXP 2025-06 LOT [id number] R  NDC [phone]

## 2023-04-19 ENCOUNTER — Ambulatory Visit: Payer: MEDICAID

## 2023-04-19 DIAGNOSIS — F319 Bipolar disorder, unspecified: Secondary | ICD-10-CM

## 2023-04-19 NOTE — Progress Notes (Signed)
NURSES NOTE:   Pt arrived for his LAI of Risperidone XR 25 mg take IM every 14 days.  Pt's last injection was on 04/06/2023. His medication is delivered by [facility] and taken out of the refrigerator 30 minutes prior to administration to get to room temperature.  Pt seemed very labile, irritated, annoyed about everything going on in his life when brought back to room for his injection. Pt very loud and voicing all his issues he is dealing with, not necessarily upset with the nurse but yelling at her about everything. Didn't feel in danger but felt very uneasy with his labile behavior, he was also standing in front of door therefore I was unable to get out unless trying to get through patient. Tried listening to patient and be encouraging, letting him vent out his frustrations and discuss his options. Pt reports feeling trapped in his situation, currently living in an apartment he reports with 2 prostitutes and he is on the couch unable to sleep due to their occupation and people coming in and out of the apartment all hours of the night. Pt reports not sleeping at all and he is unable to slow down his mind to even sleep. Suggested pt go to [facility] to be assessed if he felt like he needed that, but pt reports he does not want to be admitted to the hospital again. Pt reports his Mom won't let him back home due to his mental illness and his behavior, he yells about that situation on how his Mom doesn't understand him or know how to deal with him. Pt reports he has asked her to go to groups on dealing with his mental illness but she hasn't. Pt reports he should just not be around if he has to continue to live like this, again trying to encourage and give suggestions on his options but pt not reasonable to discuss or understand at this time. Pt reports he hasn't seen his counselor in several weeks, I have noticed when he doesn't get into his counselor his thoughts are worse and unable to manage well.     He does finally agree to get his injection. After mixing and shaking vial, drew up 2 ml of Risperidone injection, given in Left upper gluteal area. Pt yells out when I administer injection, he reports it hurts because he is so tense and can't relax to even get his injection. He never demonstrates that type of behavior normally in the office at his injection. Pt has been told several times and also given a letter a few months ago that he has to find a new provider, due to his change in insurance, his noncompliance, his behavior, his balance has also been discussed with him. His provider Yvette Rack, NP and Dr. Jennelle Human have agreed and therefore pt was advised of this in his letter.  Pt reports he is continuing to work, he also has a girlfriend he mentioned a couple weeks ago. Pt reports his girlfriend lives at home with her mother. Pt asked if I would call to discuss him moving back home with his mother upon him leaving. I was finally able to get pt out of the room down the hallway to leave after 50 minutes.  Again when pt was leaving I advised he go to [facility] to be assessed at anytime he was feeling unsafe to himself or others.  After this injection today, pt was dismissed from the practice.    EXP 2025-06 LOT [id number]  NDC  [id number] 

## 2023-08-15 ENCOUNTER — Telehealth (HOSPITAL_COMMUNITY): Payer: Self-pay

## 2023-08-15 NOTE — Telephone Encounter (Signed)
 Therapist receives a fax from [employer] referring this pt to [facility] noting they don't take [employer]. This therapist calls [name] [name] to explain how he can change his medicaid to [place], as he lives in [place] rather than in the [place] catchment area.  Therapist reaches VM and leaves a HIPAA compliant VM requesting a return call.  Remigio Eisenmenger, MS, LMFT, LCAS 08/15/23
